# Patient Record
Sex: Male | Born: 1965
Health system: Southern US, Community
[De-identification: ages and names within clinical notes are randomized; demographics above are authoritative.]

## PROBLEM LIST (undated history)

## (undated) DIAGNOSIS — N201 Calculus of ureter: Secondary | ICD-10-CM

## (undated) DIAGNOSIS — Z87442 Personal history of urinary calculi: Secondary | ICD-10-CM

## (undated) DIAGNOSIS — I10 Essential (primary) hypertension: Secondary | ICD-10-CM

## (undated) DIAGNOSIS — N200 Calculus of kidney: Secondary | ICD-10-CM

## (undated) DIAGNOSIS — Z9189 Other specified personal risk factors, not elsewhere classified: Secondary | ICD-10-CM

## (undated) DIAGNOSIS — Z973 Presence of spectacles and contact lenses: Secondary | ICD-10-CM

## (undated) DIAGNOSIS — R319 Hematuria, unspecified: Secondary | ICD-10-CM

## (undated) HISTORY — DX: Essential (primary) hypertension: I10

---

## 1999-05-10 ENCOUNTER — Ambulatory Visit (HOSPITAL_COMMUNITY): Admission: RE | Admit: 1999-05-10 | Discharge: 1999-05-10 | Payer: Self-pay | Admitting: Neurosurgery

## 1999-05-10 ENCOUNTER — Encounter: Payer: Self-pay | Admitting: Neurosurgery

## 1999-10-17 ENCOUNTER — Ambulatory Visit (HOSPITAL_COMMUNITY): Admission: RE | Admit: 1999-10-17 | Discharge: 1999-10-17 | Payer: Self-pay | Admitting: Neurosurgery

## 1999-10-17 ENCOUNTER — Encounter: Payer: Self-pay | Admitting: Neurosurgery

## 2000-02-15 ENCOUNTER — Observation Stay (HOSPITAL_COMMUNITY): Admission: EM | Admit: 2000-02-15 | Discharge: 2000-02-16 | Payer: Self-pay | Admitting: *Deleted

## 2000-02-15 ENCOUNTER — Encounter: Payer: Self-pay | Admitting: *Deleted

## 2005-05-28 ENCOUNTER — Emergency Department (HOSPITAL_COMMUNITY): Admission: EM | Admit: 2005-05-28 | Discharge: 2005-05-28 | Payer: Self-pay | Admitting: Family Medicine

## 2005-10-04 ENCOUNTER — Emergency Department (HOSPITAL_COMMUNITY): Admission: EM | Admit: 2005-10-04 | Discharge: 2005-10-04 | Payer: Self-pay | Admitting: Family Medicine

## 2006-04-19 ENCOUNTER — Emergency Department (HOSPITAL_COMMUNITY): Admission: EM | Admit: 2006-04-19 | Discharge: 2006-04-19 | Payer: Self-pay | Admitting: Emergency Medicine

## 2006-08-07 ENCOUNTER — Emergency Department (HOSPITAL_COMMUNITY): Admission: EM | Admit: 2006-08-07 | Discharge: 2006-08-07 | Payer: Self-pay | Admitting: Family Medicine

## 2007-03-24 ENCOUNTER — Emergency Department (HOSPITAL_COMMUNITY): Admission: EM | Admit: 2007-03-24 | Discharge: 2007-03-24 | Payer: Self-pay | Admitting: Family Medicine

## 2007-04-17 ENCOUNTER — Emergency Department (HOSPITAL_COMMUNITY): Admission: EM | Admit: 2007-04-17 | Discharge: 2007-04-17 | Payer: Self-pay | Admitting: Emergency Medicine

## 2009-08-04 ENCOUNTER — Encounter: Admission: RE | Admit: 2009-08-04 | Discharge: 2009-08-04 | Payer: Self-pay | Admitting: Orthopedic Surgery

## 2010-11-21 ENCOUNTER — Inpatient Hospital Stay (INDEPENDENT_AMBULATORY_CARE_PROVIDER_SITE_OTHER)
Admission: RE | Admit: 2010-11-21 | Discharge: 2010-11-21 | Disposition: A | Discharge status: Home / Self Care | Payer: BC Managed Care – PPO | Source: Ambulatory Visit | Attending: Family Medicine | Admitting: Family Medicine

## 2010-11-21 DIAGNOSIS — L0231 Cutaneous abscess of buttock: Secondary | ICD-10-CM

## 2010-11-21 DIAGNOSIS — L03317 Cellulitis of buttock: Secondary | ICD-10-CM

## 2010-11-24 ENCOUNTER — Inpatient Hospital Stay (HOSPITAL_COMMUNITY)
Admission: RE | Admit: 2010-11-24 | Discharge: 2010-11-24 | Disposition: A | Discharge status: Home / Self Care | Payer: BC Managed Care – PPO | Source: Ambulatory Visit | Attending: Family Medicine | Admitting: Family Medicine

## 2010-11-24 LAB — CULTURE, ROUTINE-ABSCESS

## 2012-09-13 HISTORY — PX: KNEE ARTHROSCOPY W/ MENISCECTOMY: SHX1879

## 2012-11-02 ENCOUNTER — Other Ambulatory Visit (HOSPITAL_COMMUNITY): Payer: Self-pay | Admitting: Orthopedic Surgery

## 2012-11-02 DIAGNOSIS — M25562 Pain in left knee: Secondary | ICD-10-CM

## 2012-11-06 ENCOUNTER — Ambulatory Visit (HOSPITAL_COMMUNITY)
Admission: RE | Admit: 2012-11-06 | Discharge: 2012-11-06 | Disposition: A | Discharge status: Home / Self Care | Payer: BC Managed Care – PPO | Source: Ambulatory Visit | Attending: Orthopedic Surgery | Admitting: Orthopedic Surgery

## 2012-11-06 DIAGNOSIS — M25562 Pain in left knee: Secondary | ICD-10-CM

## 2012-11-06 DIAGNOSIS — M25569 Pain in unspecified knee: Secondary | ICD-10-CM | POA: Insufficient documentation

## 2012-11-06 DIAGNOSIS — IMO0002 Reserved for concepts with insufficient information to code with codable children: Secondary | ICD-10-CM | POA: Insufficient documentation

## 2012-11-06 DIAGNOSIS — R609 Edema, unspecified: Secondary | ICD-10-CM | POA: Insufficient documentation

## 2012-11-06 DIAGNOSIS — W19XXXA Unspecified fall, initial encounter: Secondary | ICD-10-CM | POA: Insufficient documentation

## 2014-10-03 ENCOUNTER — Ambulatory Visit (INDEPENDENT_AMBULATORY_CARE_PROVIDER_SITE_OTHER): Payer: BLUE CROSS/BLUE SHIELD

## 2014-10-03 ENCOUNTER — Ambulatory Visit (INDEPENDENT_AMBULATORY_CARE_PROVIDER_SITE_OTHER): Payer: BLUE CROSS/BLUE SHIELD | Admitting: Family Medicine

## 2014-10-03 VITALS — BP 128/86 | HR 99 | Temp 98.5°F | Resp 20 | Ht 72.5 in | Wt 308.4 lb

## 2014-10-03 DIAGNOSIS — R109 Unspecified abdominal pain: Secondary | ICD-10-CM

## 2014-10-03 DIAGNOSIS — D72829 Elevated white blood cell count, unspecified: Secondary | ICD-10-CM

## 2014-10-03 DIAGNOSIS — Z87442 Personal history of urinary calculi: Secondary | ICD-10-CM

## 2014-10-03 LAB — POCT UA - MICROSCOPIC ONLY
Bacteria, U Microscopic: NEGATIVE
Casts, Ur, LPF, POC: NEGATIVE
Crystals, Ur, HPF, POC: NEGATIVE
Yeast, UA: NEGATIVE

## 2014-10-03 LAB — POCT CBC
Granulocyte percent: 86.5 % — AB (ref 37–80)
HCT, POC: 48.8 % (ref 43.5–53.7)
Hemoglobin: 16.2 g/dL (ref 14.1–18.1)
Lymph, poc: 1.2 (ref 0.6–3.4)
MCH, POC: 31 pg (ref 27–31.2)
MCHC: 33.2 g/dL (ref 31.8–35.4)
MCV: 93.6 fL (ref 80–97)
MID (cbc): 0.3 (ref 0–0.9)
MPV: 7.8 fL (ref 0–99.8)
POC Granulocyte: 10 — AB (ref 2–6.9)
POC LYMPH PERCENT: 10.5 % (ref 10–50)
POC MID %: 3 % (ref 0–12)
Platelet Count, POC: 180 10*3/uL (ref 142–424)
RBC: 5.21 M/uL (ref 4.69–6.13)
RDW, POC: 13.1 %
WBC: 11.6 10*3/uL — AB (ref 4.6–10.2)

## 2014-10-03 LAB — POCT URINALYSIS DIPSTICK
Bilirubin, UA: NEGATIVE
Glucose, UA: NEGATIVE
Ketones, UA: NEGATIVE
Leukocytes, UA: NEGATIVE
Nitrite, UA: NEGATIVE
Spec Grav, UA: 1.02
Urobilinogen, UA: 0.2
pH, UA: 5

## 2014-10-03 LAB — COMPREHENSIVE METABOLIC PANEL
AST: 23 U/L (ref 0–37)
Calcium: 9.8 mg/dL (ref 8.4–10.5)
Creat: 1.47 mg/dL — ABNORMAL HIGH (ref 0.50–1.35)
Potassium: 3.8 mEq/L (ref 3.5–5.3)
Sodium: 142 mEq/L (ref 135–145)

## 2014-10-03 LAB — COMPREHENSIVE METABOLIC PANEL WITH GFR
ALT: 15 U/L (ref 0–53)
Albumin: 4.5 g/dL (ref 3.5–5.2)
Alkaline Phosphatase: 63 U/L (ref 39–117)
BUN: 20 mg/dL (ref 6–23)
CO2: 29 meq/L (ref 19–32)
Chloride: 103 meq/L (ref 96–112)
Glucose, Bld: 121 mg/dL — ABNORMAL HIGH (ref 70–99)
Total Bilirubin: 0.9 mg/dL (ref 0.2–1.2)
Total Protein: 7.7 g/dL (ref 6.0–8.3)

## 2014-10-03 MED ORDER — HYDROCODONE-ACETAMINOPHEN 5-325 MG PO TABS: 1.0000 | ORAL_TABLET | Freq: Four times a day (QID) | ORAL | Status: DC | PRN

## 2014-10-03 MED ORDER — TAMSULOSIN HCL 0.4 MG PO CAPS: 0.4000 mg | ORAL_CAPSULE | Freq: Every day | ORAL | Status: DC

## 2014-10-03 MED ORDER — KETOROLAC TROMETHAMINE 60 MG/2ML IM SOLN
60.0000 mg | Freq: Once | INTRAMUSCULAR | Status: AC
  Administered 2014-10-03: 60 mg via INTRAMUSCULAR

## 2014-10-03 MED ORDER — CEPHALEXIN 500 MG PO CAPS: 500.0000 mg | ORAL_CAPSULE | Freq: Two times a day (BID) | ORAL | Status: DC

## 2014-10-03 NOTE — Progress Notes (Signed)
Chief Complaint:  Chief Complaint  Patient presents with  . Nephrolithiasis    Started having symptoms around 230am this morning.     HPI: George Ortega is a 49 y.o. male who is here for  Left flank pain starting  this AM, woke him up from sleep, he ahd some pain pills left over and took it, he has had hx of kidney stone, no fevers but did have 8/10 pain, reminds him of prior kidney stones, he had some norco left over and took that and no whe has limited pain. He has been dehydrated. Last renal stone was on right side. HE has been able to drink adn eat but not his usual. He denies fevers, chills, dysuria, +/- nausea, no emesis.  Past Medical History  Diagnosis Date  . Hypertension    Past Surgical History  Procedure Laterality Date  . Knee surgery Left    History   Social History  . Marital Status: Married    Spouse Name: N/A    Number of Children: N/A  . Years of Education: N/A   Social History Main Topics  . Smoking status: Never Smoker   . Smokeless tobacco: None  . Alcohol Use: 0.0 oz/week    0 Not specified per week     Comment: Socially  . Drug Use: No  . Sexual Activity: None   Other Topics Concern  . None   Social History Narrative  . None   Family History  Problem Relation Age of Onset  . Hypertension Mother   . Cancer Father   . Heart disease Father   . Hypertension Father    No Known Allergies Prior to Admission medications   Medication Sig Start Date End Date Taking? Authorizing Provider  chlorthalidone (HYGROTON) 25 MG tablet Take 25 mg by mouth daily.   Yes Historical Provider, MD  metoprolol succinate (TOPROL-XL) 100 MG 24 hr tablet Take 100 mg by mouth daily. Take with or immediately following a meal.   Yes Historical Provider, MD  potassium chloride (KLOR-CON) 20 MEQ packet Take 20 mEq by mouth daily.   Yes Historical Provider, MD  telmisartan (MICARDIS) 80 MG tablet Take 80 mg by mouth daily.   Yes Historical Provider, MD     ROS:  The patient denies fevers, chills, night sweats, unintentional weight loss, chest pain, palpitations, wheezing, dyspnea on exertion,abdominal pain, dysuria, hematuria, melena, numbness, weakness, or tingling.   All other systems have been reviewed and were otherwise negative with the exception of those mentioned in the HPI and as above.    PHYSICAL EXAM: Filed Vitals:   10/03/14 0845  BP: 128/86  Pulse: 99  Temp: 98.5 F (36.9 C)  Resp: 20   Filed Vitals:   10/03/14 0845  Height: 6' 0.5" (1.842 m)  Weight: 308 lb 6.4 oz (139.889 kg)   Body mass index is 41.23 kg/(m^2).  General: Alert, no acute distress, obese amle HEENT:  Normocephalic, atraumatic, oropharynx patent. EOMI, PERRLA Cardiovascular:  Regular rate and rhythm, no rubs murmurs or gallops. Radial pulse intact. No pedal edema.  Respiratory: Clear to auscultation bilaterally.  No wheezes, rales, or rhonchi.  No cyanosis, no use of accessory musculature GI: No organomegaly, abdomen is soft and minimally tender, positive bowel sounds.  No masses. Skin: No rashes. Neurologic: Facial musculature symmetric. Psychiatric: Patient is appropriate throughout our interaction. Lymphatic: No cervical lymphadenopathy Musculoskeletal: Gait intact. + flank pain   LABS: Results for orders placed or performed  in visit on 10/03/14  POCT urinalysis dipstick  Result Value Ref Range   Color, UA yellow    Clarity, UA clear    Glucose, UA neg    Bilirubin, UA neg    Ketones, UA neg    Spec Grav, UA 1.020    Blood, UA large    pH, UA 5.0    Protein, UA trace    Urobilinogen, UA 0.2    Nitrite, UA neg    Leukocytes, UA Negative   POCT UA - Microscopic Only  Result Value Ref Range   WBC, Ur, HPF, POC 0-2    RBC, urine, microscopic 15-21    Bacteria, U Microscopic neg    Mucus, UA trace    Epithelial cells, urine per micros 0-1    Crystals, Ur, HPF, POC neg    Casts, Ur, LPF, POC neg    Yeast, UA neg   POCT CBC  Result Value  Ref Range   WBC 11.6 (A) 4.6 - 10.2 K/uL   Lymph, poc 1.2 0.6 - 3.4   POC LYMPH PERCENT 10.5 10 - 50 %L   MID (cbc) 0.3 0 - 0.9   POC MID % 3.0 0 - 12 %M   POC Granulocyte 10.0 (A) 2 - 6.9   Granulocyte percent 86.5 (A) 37 - 80 %G   RBC 5.21 4.69 - 6.13 M/uL   Hemoglobin 16.2 14.1 - 18.1 g/dL   HCT, POC 16.1 09.6 - 53.7 %   MCV 93.6 80 - 97 fL   MCH, POC 31.0 27 - 31.2 pg   MCHC 33.2 31.8 - 35.4 g/dL   RDW, POC 04.5 %   Platelet Count, POC 180 142 - 424 K/uL   MPV 7.8 0 - 99.8 fL     EKG/XRAY:   Primary read interpreted by Dr. Conley Rolls at [facility]. No obvious renal stones, please comment if those are phleboliths in the bladder    ASSESSMENT/PLAN: Encounter Diagnoses  Name Primary?  . Left flank pain Yes  . History of kidney stones   . Leukocytosis    Most likely renal stone that has passed Rx norco, flomax, keflex x 3 days due to elevated white count and nausea Strainer given IVF in house x 2 L, advise to push Fluids at home and advance clear liquids, BRAT diet as tolerated CMP pending F/u prn   Gross sideeffects, risk and benefits, and alternatives of medications d/w patient. Patient is aware that all medications have potential sideeffects and we are unable to predict every sideeffect or drug-drug interaction that may occur.  Laquisha Northcraft PHUONG, DO 10/03/2014 10:48 AM

## 2014-10-03 NOTE — Patient Instructions (Signed)
Ureteral Colic (Kidney Stones) °Ureteral colic is the result of a condition when kidney stones form inside the kidney. Once kidney stones are formed they may move into the tube that connects the kidney with the bladder (ureter). If this occurs, this condition may cause pain (colic) in the ureter.  °CAUSES  °Pain is caused by stone movement in the ureter and the obstruction caused by the stone. °SYMPTOMS  °The pain comes and goes as the ureter contracts around the stone. The pain is usually intense, sharp, and stabbing in character. The location of the pain may move as the stone moves through the ureter. When the stone is near the kidney the pain is usually located in the back and radiates to the belly (abdomen). When the stone is ready to pass into the bladder the pain is often located in the lower abdomen on the side the stone is located. At this location, the symptoms may mimic those of a urinary tract infection with urinary frequency. Once the stone is located here it often passes into the bladder and the pain disappears completely. °TREATMENT  °· Your caregiver will provide you with medicine for pain relief. °· You may require specialized follow-up X-rays. °· The absence of pain does not always mean that the stone has passed. It may have just stopped moving. If the urine remains completely obstructed, it can cause loss of kidney function or even complete destruction of the involved kidney. It is your responsibility and in your interest that X-rays and follow-ups as suggested by your caregiver are completed. Relief of pain without passage of the stone can be associated with severe damage to the kidney, including loss of kidney function on that side. °· If your stone does not pass on its own, additional measures may be taken by your caregiver to ensure its removal. °HOME CARE INSTRUCTIONS  °· Increase your fluid intake. Water is the preferred fluid since juices containing vitamin C may acidify the urine making it  less likely for certain stones (uric acid stones) to pass. °· Strain all urine. A strainer will be provided. Keep all particulate matter or stones for your caregiver to inspect. °· Take your pain medicine as directed. °· Make a follow-up appointment with your caregiver as directed. °· Remember that the goal is passage of your stone. The absence of pain does not mean the stone is gone. Follow your caregiver's instructions. °· Only take over-the-counter or prescription medicines for pain, discomfort, or fever as directed by your caregiver. °SEEK MEDICAL CARE IF:  °· Pain cannot be controlled with the prescribed medicine. °· You have a fever. °· Pain continues for longer than your caregiver advises it should. °· There is a change in the pain, and you develop chest discomfort or constant abdominal pain. °· You feel faint or pass out. °MAKE SURE YOU:  °· Understand these instructions. °· Will watch your condition. °· Will get help right away if you are not doing well or get worse. °Document Released: 06/09/2005 Document Revised: 12/25/2012 Document Reviewed: 02/24/2011 °Exit[employer]® Patient Information ©2015 [employer]. This information is not intended to replace advice given to you by your health care provider. Make sure you discuss any questions you have with your health care provider. ° °

## 2014-10-09 ENCOUNTER — Other Ambulatory Visit: Payer: Self-pay | Admitting: Urology

## 2014-10-10 ENCOUNTER — Encounter (HOSPITAL_BASED_OUTPATIENT_CLINIC_OR_DEPARTMENT_OTHER): Payer: Self-pay | Admitting: *Deleted

## 2014-10-10 NOTE — Progress Notes (Signed)
NPO AFTER MN. ARRIVE AT [id number]. NEEDS EKG.  CURRENT LAB RESULTS IN CHART AND EPIC. WILL  TAKE MICARDIS AND TOPROL AM DOS W/ SIPS OF WATER.

## 2014-10-10 NOTE — Progress Notes (Signed)
   10/10/14 1121  OBSTRUCTIVE SLEEP APNEA  Have you ever been diagnosed with sleep apnea through a sleep study? No  Do you snore loudly (loud enough to be heard through closed doors)?  1  Do you often feel tired, fatigued, or sleepy during the daytime? 0  Has anyone observed you stop breathing during your sleep? 1  Do you have, or are you being treated for high blood pressure? 1  BMI more than 35 kg/m2? 1  Age over 50 years old? 0  Neck circumference greater than 40 cm/16 inches? 1  Gender: 1  Obstructive Sleep Apnea Score 6  Score 4 or greater  Results sent to PCP

## 2014-10-13 ENCOUNTER — Encounter (HOSPITAL_BASED_OUTPATIENT_CLINIC_OR_DEPARTMENT_OTHER): Payer: Self-pay | Admitting: Anesthesiology

## 2014-10-13 NOTE — Anesthesia Preprocedure Evaluation (Deleted)
Anesthesia Evaluation  Patient identified by MRN, date of birth, ID band Patient awake    Reviewed: Allergy & Precautions, NPO status , Patient's Chart, lab work & pertinent test results  History of Anesthesia Complications Negative for: history of anesthetic complications  Airway Mallampati: III  TM Distance: >3 FB Neck ROM: Full    Dental no notable dental hx. (+) Dental Advisory Given   Pulmonary neg pulmonary ROS,  breath sounds clear to auscultation  Pulmonary exam normal       Cardiovascular hypertension, Pt. on medications and Pt. on home beta blockers Rhythm:Regular Rate:Normal     Neuro/Psych negative neurological ROS  negative psych ROS   GI/Hepatic negative GI ROS, Neg liver ROS,   Endo/Other  Morbid obesity  Renal/GU Renal disease  negative genitourinary   Musculoskeletal negative musculoskeletal ROS (+)   Abdominal   Peds negative pediatric ROS (+)  Hematology negative hematology ROS (+)   Anesthesia Other Findings   Reproductive/Obstetrics negative OB ROS                             Anesthesia Physical Anesthesia Plan  ASA: III  Anesthesia Plan: General   Post-op Pain Management:    Induction: Intravenous  Airway Management Planned: LMA  Additional Equipment:   Intra-op Plan:   Post-operative Plan: Extubation in OR  Informed Consent: I have reviewed the patients History and Physical, chart, labs and discussed the procedure including the risks, benefits and alternatives for the proposed anesthesia with the patient or authorized representative who has indicated his/her understanding and acceptance.   Dental advisory given  Plan Discussed with: CRNA  Anesthesia Plan Comments:         Anesthesia Quick Evaluation

## 2014-10-15 ENCOUNTER — Ambulatory Visit (HOSPITAL_BASED_OUTPATIENT_CLINIC_OR_DEPARTMENT_OTHER): Admission: RE | Admit: 2014-10-15 | Payer: BLUE CROSS/BLUE SHIELD | Source: Ambulatory Visit | Admitting: Urology

## 2014-10-15 HISTORY — DX: Calculus of kidney: N20.0

## 2014-10-15 HISTORY — DX: Other specified personal risk factors, not elsewhere classified: Z91.89

## 2014-10-15 HISTORY — DX: Hematuria, unspecified: R31.9

## 2014-10-15 HISTORY — DX: Personal history of urinary calculi: Z87.442

## 2014-10-15 HISTORY — DX: Calculus of ureter: N20.1

## 2014-10-15 HISTORY — DX: Presence of spectacles and contact lenses: Z97.3

## 2014-10-15 SURGERY — CYSTOURETEROSCOPY, WITH RETROGRADE PYELOGRAM AND STENT INSERTION
Anesthesia: General | Laterality: Left

## 2015-08-22 ENCOUNTER — Other Ambulatory Visit: Payer: Self-pay | Admitting: Nurse Practitioner

## 2015-08-22 DIAGNOSIS — R200 Anesthesia of skin: Secondary | ICD-10-CM

## 2015-09-17 ENCOUNTER — Other Ambulatory Visit: Payer: BLUE CROSS/BLUE SHIELD

## 2015-12-09 IMAGING — CR DG ABDOMEN 1V
1 series · 1 of 1 positions shown · non-contrast
Comparison: None.

CLINICAL DATA: 48-year-old male with left flank pain. History of
kidney stones.

EXAM:
ABDOMEN - 1 VIEW

[AP]
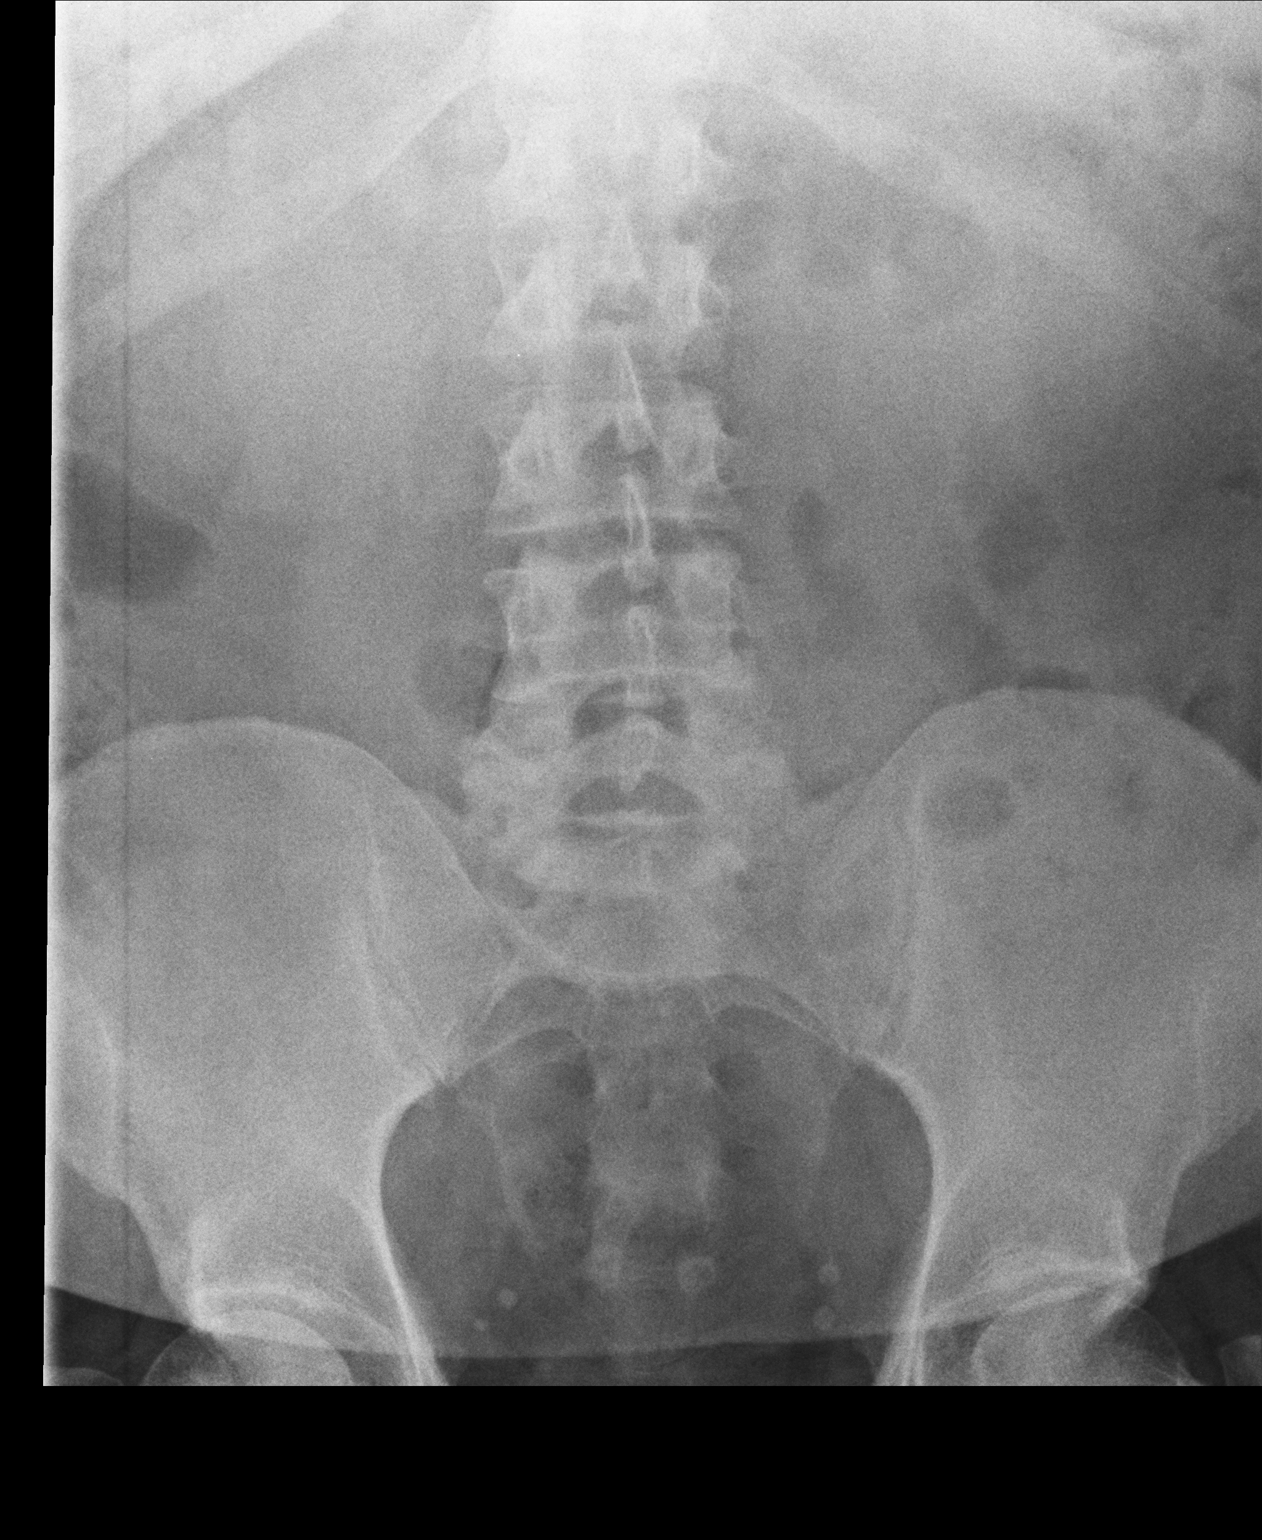

[1 of 1 positions shown; findings below may reference images not displayed]

FINDINGS: No plain film evidence of renal or ureteral calculi. Calcifications
within the pelvis have an appearance most suggestive of phleboliths.

If further delineation is clinically desired CT imaging may be
considered.

Nonspecific bowel gas pattern without plain film evidence of bowel
obstruction.

Osseous structures appear intact.
IMPRESSION: No plain film evidence of renal or ureteral calculi as noted above.

## 2016-03-23 DIAGNOSIS — S41111A Laceration without foreign body of right upper arm, initial encounter: Secondary | ICD-10-CM | POA: Diagnosis not present

## 2016-03-23 DIAGNOSIS — N183 Chronic kidney disease, stage 3 (moderate): Secondary | ICD-10-CM | POA: Diagnosis not present

## 2016-03-23 DIAGNOSIS — I129 Hypertensive chronic kidney disease with stage 1 through stage 4 chronic kidney disease, or unspecified chronic kidney disease: Secondary | ICD-10-CM | POA: Diagnosis not present

## 2016-03-23 DIAGNOSIS — I10 Essential (primary) hypertension: Secondary | ICD-10-CM | POA: Diagnosis not present

## 2016-03-23 DIAGNOSIS — Z23 Encounter for immunization: Secondary | ICD-10-CM | POA: Diagnosis not present

## 2016-05-19 DIAGNOSIS — H5213 Myopia, bilateral: Secondary | ICD-10-CM | POA: Diagnosis not present

## 2016-09-23 DIAGNOSIS — I10 Essential (primary) hypertension: Secondary | ICD-10-CM | POA: Diagnosis not present

## 2016-10-08 DIAGNOSIS — J1001 Influenza due to other identified influenza virus with the same other identified influenza virus pneumonia: Secondary | ICD-10-CM | POA: Diagnosis not present

## 2016-10-08 DIAGNOSIS — J200 Acute bronchitis due to Mycoplasma pneumoniae: Secondary | ICD-10-CM | POA: Diagnosis not present

## 2016-11-12 ENCOUNTER — Encounter: Payer: Self-pay | Admitting: Internal Medicine

## 2017-01-10 ENCOUNTER — Encounter: Payer: Self-pay | Admitting: Internal Medicine

## 2017-01-10 ENCOUNTER — Ambulatory Visit: Payer: BLUE CROSS/BLUE SHIELD

## 2017-01-10 VITALS — Ht 71.0 in | Wt 273.0 lb

## 2017-01-10 DIAGNOSIS — Z1211 Encounter for screening for malignant neoplasm of colon: Secondary | ICD-10-CM

## 2017-01-10 MED ORDER — SUPREP BOWEL PREP KIT 17.5-3.13-1.6 GM/177ML PO SOLN: 1.0000 | Freq: Once | ORAL | 0 refills | Status: AC

## 2017-01-10 NOTE — Progress Notes (Signed)
No allergies to eggs or soy No past problems with anesthesia No diet meds No home oxygen  Registered emmi 

## 2017-01-24 ENCOUNTER — Ambulatory Visit (AMBULATORY_SURGERY_CENTER): Payer: BLUE CROSS/BLUE SHIELD | Admitting: Internal Medicine

## 2017-01-24 ENCOUNTER — Encounter: Payer: Self-pay | Admitting: Internal Medicine

## 2017-01-24 VITALS — BP 116/95 | HR 71 | Temp 98.2°F | Resp 20 | Ht 72.5 in | Wt 273.0 lb

## 2017-01-24 DIAGNOSIS — Z1211 Encounter for screening for malignant neoplasm of colon: Secondary | ICD-10-CM

## 2017-01-24 DIAGNOSIS — Z1212 Encounter for screening for malignant neoplasm of rectum: Secondary | ICD-10-CM | POA: Diagnosis not present

## 2017-01-24 MED ORDER — SODIUM CHLORIDE 0.9 % IV SOLN: 500.0000 mL | INTRAVENOUS | Status: AC

## 2017-01-24 NOTE — Patient Instructions (Signed)
Discharge instructions given. Handout on hemorrhoids. Resume previous medications. YOU HAD AN ENDOSCOPIC PROCEDURE TODAY AT THE [facility]:   Refer to the procedure report that was given to you for any specific questions about what was found during the examination.  If the procedure report does not answer your questions, please call your gastroenterologist to clarify.  If you requested that your care partner not be given the details of your procedure findings, then the procedure report has been included in a sealed envelope for you to review at your convenience later.  YOU SHOULD EXPECT: Some feelings of bloating in the abdomen. Passage of more gas than usual.  Walking can help get rid of the air that was put into your GI tract during the procedure and reduce the bloating. If you had a lower endoscopy (such as a colonoscopy or flexible sigmoidoscopy) you may notice spotting of blood in your stool or on the toilet paper. If you underwent a bowel prep for your procedure, you may not have a normal bowel movement for a few days.  Please Note:  You might notice some irritation and congestion in your nose or some drainage.  This is from the oxygen used during your procedure.  There is no need for concern and it should clear up in a day or so.  SYMPTOMS TO REPORT IMMEDIATELY:   Following lower endoscopy (colonoscopy or flexible sigmoidoscopy):  Excessive amounts of blood in the stool  Significant tenderness or worsening of abdominal pains  Swelling of the abdomen that is new, acute  Fever of 100F or higher   For urgent or emergent issues, a gastroenterologist can be reached at any hour by calling [phone].   DIET:  We do recommend a small meal at first, but then you may proceed to your regular diet.  Drink plenty of fluids but you should avoid alcoholic beverages for 24 hours.  ACTIVITY:  You should plan to take it easy for the rest of today and you should NOT DRIVE or use heavy  machinery until tomorrow (because of the sedation medicines used during the test).    FOLLOW UP: Our staff will call the number listed on your records the next business day following your procedure to check on you and address any questions or concerns that you may have regarding the information given to you following your procedure. If we do not reach you, we will leave a message.  However, if you are feeling well and you are not experiencing any problems, there is no need to return our call.  We will assume that you have returned to your regular daily activities without incident.  If any biopsies were taken you will be contacted by phone or by letter within the next 1-3 weeks.  Please call us at [phone] if you have not heard about the biopsies in 3 weeks.    SIGNATURES/CONFIDENTIALITY: You and/or your care partner have signed paperwork which will be entered into your electronic medical record.  These signatures attest to the fact that that the information above on your After Visit Summary has been reviewed and is understood.  Full responsibility of the confidentiality of this discharge information lies with you and/or your care-partner. 

## 2017-01-24 NOTE — Progress Notes (Signed)
Report given to PACU, vss 

## 2017-01-24 NOTE — Op Note (Signed)
[facility] Patient Name: George PalMichael Frisbie Procedure Date: 01/24/2017 8:37 AM MRN: [id number] Endoscopist: Beverley FiedlerJay M Azion Centrella , MD Age: [age] Referring MD:  Date of Birth: April 25, 1966 Gender: Male Account #: [id number] Procedure:                Colonoscopy Indications:              Screening for colorectal malignant neoplasm, This                            is the patient's first colonoscopy Medicines:                Monitored Anesthesia Care Procedure:                Pre-Anesthesia Assessment:                           - Prior to the procedure, a History and Physical                            was performed, and patient medications and                            allergies were reviewed. The patient's tolerance of                            previous anesthesia was also reviewed. The risks                            and benefits of the procedure and the sedation                            options and risks were discussed with the patient.                            All questions were answered, and informed consent                            was obtained. Prior Anticoagulants: The patient has                            taken no previous anticoagulant or antiplatelet                            agents. ASA Grade Assessment: II - A patient with                            mild systemic disease. After reviewing the risks                            and benefits, the patient was deemed in                            satisfactory condition to undergo the procedure.  After obtaining informed consent, the colonoscope                            was passed under direct vision. Throughout the                            procedure, the patient's blood pressure, pulse, and                            oxygen saturations were monitored continuously. The                            Colonoscope was introduced through the anus and                            advanced to the the terminal  ileum. The colonoscopy                            was performed without difficulty. The patient                            tolerated the procedure well. The quality of the                            bowel preparation was excellent. The terminal                            ileum, ileocecal valve, appendiceal orifice, and                            rectum were photographed. Scope In: 8:47:14 AM Scope Out: 8:59:12 AM Scope Withdrawal Time: 0 hours 9 minutes 3 seconds  Total Procedure Duration: 0 hours 11 minutes 58 seconds  Findings:                 The digital rectal exam was normal.                           The terminal ileum appeared normal.                           Non-bleeding internal hemorrhoids were found during                            retroflexion. The hemorrhoids were small.                           The exam was otherwise without abnormality. No                            polyps found. Complications:            No immediate complications. Estimated Blood Loss:     Estimated blood loss: none. Impression:               - The examined portion of the ileum was normal.                           -  Small non-bleeding internal hemorrhoids.                           - The examination was otherwise normal.                           - No specimens collected. Recommendation:           - Patient has a contact number available for                            emergencies. The signs and symptoms of potential                            delayed complications were discussed with the                            patient. Return to normal activities tomorrow.                            Written discharge instructions were provided to the                            patient.                           - Resume previous diet.                           - Continue present medications.                           - Repeat colonoscopy in 10 years for screening                            purposes. Beverley Fiedler, MD 01/24/2017 9:03:16 AM This report has been signed electronically.

## 2017-01-25 ENCOUNTER — Telehealth: Payer: Self-pay | Admitting: *Deleted

## 2017-01-25 NOTE — Telephone Encounter (Signed)
  Follow up Call-  Call back number 01/24/2017  Post procedure Call Back phone  # [phone]  Permission to leave phone message Yes  Some recent data might be hidden     Patient questions:  Do you have a fever, pain , or abdominal swelling? No. Pain Score  0 *  Have you tolerated food without any problems? Yes.    Have you been able to return to your normal activities? Yes.    Do you have any questions about your discharge instructions: Diet   No. Medications  No. Follow up visit  No.  Do you have questions or concerns about your Care? No.  Actions: * If pain score is 4 or above: No action needed, pain <4.

## 2017-04-21 DIAGNOSIS — I10 Essential (primary) hypertension: Secondary | ICD-10-CM | POA: Diagnosis not present

## 2017-06-09 DIAGNOSIS — H524 Presbyopia: Secondary | ICD-10-CM | POA: Diagnosis not present

## 2017-06-09 DIAGNOSIS — H52223 Regular astigmatism, bilateral: Secondary | ICD-10-CM | POA: Diagnosis not present

## 2017-06-09 DIAGNOSIS — H5213 Myopia, bilateral: Secondary | ICD-10-CM | POA: Diagnosis not present

## 2017-08-17 DIAGNOSIS — D485 Neoplasm of uncertain behavior of skin: Secondary | ICD-10-CM | POA: Diagnosis not present

## 2017-08-17 DIAGNOSIS — C44612 Basal cell carcinoma of skin of right upper limb, including shoulder: Secondary | ICD-10-CM | POA: Diagnosis not present

## 2017-08-17 DIAGNOSIS — L570 Actinic keratosis: Secondary | ICD-10-CM | POA: Diagnosis not present

## 2017-09-27 DIAGNOSIS — C44612 Basal cell carcinoma of skin of right upper limb, including shoulder: Secondary | ICD-10-CM | POA: Diagnosis not present

## 2017-09-27 DIAGNOSIS — L280 Lichen simplex chronicus: Secondary | ICD-10-CM | POA: Diagnosis not present

## 2017-09-27 DIAGNOSIS — D1801 Hemangioma of skin and subcutaneous tissue: Secondary | ICD-10-CM | POA: Diagnosis not present

## 2017-09-27 DIAGNOSIS — D485 Neoplasm of uncertain behavior of skin: Secondary | ICD-10-CM | POA: Diagnosis not present

## 2017-09-27 DIAGNOSIS — L814 Other melanin hyperpigmentation: Secondary | ICD-10-CM | POA: Diagnosis not present

## 2017-10-27 DIAGNOSIS — C44612 Basal cell carcinoma of skin of right upper limb, including shoulder: Secondary | ICD-10-CM | POA: Diagnosis not present

## 2017-12-07 ENCOUNTER — Other Ambulatory Visit: Payer: Self-pay | Admitting: Family

## 2017-12-07 ENCOUNTER — Ambulatory Visit
Admission: RE | Admit: 2017-12-07 | Discharge: 2017-12-07 | Disposition: A | Discharge status: Home / Self Care | Payer: BLUE CROSS/BLUE SHIELD | Source: Ambulatory Visit | Attending: Family | Admitting: Family

## 2017-12-07 DIAGNOSIS — M25571 Pain in right ankle and joints of right foot: Secondary | ICD-10-CM

## 2017-12-07 DIAGNOSIS — M19071 Primary osteoarthritis, right ankle and foot: Secondary | ICD-10-CM | POA: Diagnosis not present

## 2018-05-19 ENCOUNTER — Telehealth: Payer: Self-pay | Admitting: Family Medicine

## 2018-05-24 NOTE — Telephone Encounter (Signed)
error 

## 2018-05-30 DIAGNOSIS — M25562 Pain in left knee: Secondary | ICD-10-CM | POA: Diagnosis not present

## 2018-05-31 MED FILL — HYDROCODON-APAP 5-325: 5-325 | 10 days supply | Qty: 30 | Fill #0

## 2018-06-06 DIAGNOSIS — M25562 Pain in left knee: Secondary | ICD-10-CM | POA: Diagnosis not present

## 2018-06-27 DIAGNOSIS — H524 Presbyopia: Secondary | ICD-10-CM | POA: Diagnosis not present

## 2018-06-27 DIAGNOSIS — H5213 Myopia, bilateral: Secondary | ICD-10-CM | POA: Diagnosis not present

## 2018-06-27 DIAGNOSIS — H52223 Regular astigmatism, bilateral: Secondary | ICD-10-CM | POA: Diagnosis not present

## 2018-08-01 MED FILL — IRBESARTAN 150 MG TAB: 150 | 90 days supply | Qty: 90 | Fill #0

## 2018-08-02 MED FILL — CLINDAMYCIN PH 1% GEL: 1 | 15 days supply | Qty: 30 | Fill #0

## 2018-08-02 MED FILL — MITIGARE 0.6 MG CAPSULE: 0.6 | 30 days supply | Qty: 31 | Fill #0

## 2018-08-02 MED FILL — MELOXICAM 15 MG TABLET: 15 | 30 days supply | Qty: 30 | Fill #0

## 2018-08-03 MED FILL — traMADol HCL 50 MG TABS: 50 | 7 days supply | Qty: 21 | Fill #0

## 2018-09-14 MED FILL — CHLORTHALIDONE 25 MG TABS: 25 | 90 days supply | Qty: 90 | Fill #0

## 2018-09-14 MED FILL — METOPROLOL SUCCINATE ER 50: 50 | 90 days supply | Qty: 90 | Fill #0

## 2018-09-27 DIAGNOSIS — L814 Other melanin hyperpigmentation: Secondary | ICD-10-CM | POA: Diagnosis not present

## 2018-09-27 DIAGNOSIS — Z85828 Personal history of other malignant neoplasm of skin: Secondary | ICD-10-CM | POA: Diagnosis not present

## 2018-09-27 DIAGNOSIS — D225 Melanocytic nevi of trunk: Secondary | ICD-10-CM | POA: Diagnosis not present

## 2018-09-27 DIAGNOSIS — L821 Other seborrheic keratosis: Secondary | ICD-10-CM | POA: Diagnosis not present

## 2018-09-27 DIAGNOSIS — L570 Actinic keratosis: Secondary | ICD-10-CM | POA: Diagnosis not present

## 2018-10-31 MED FILL — POTASSIUM CHLORIDE CRYS ER: 20 | 90 days supply | Qty: 90 | Fill #0

## 2018-11-01 MED FILL — IRBESARTAN 150 MG TAB: 150 | 90 days supply | Qty: 90 | Fill #0

## 2018-11-13 MED FILL — MELOXICAM 15 MG TABLET: 15 | 30 days supply | Qty: 30 | Fill #0

## 2018-11-24 MED FILL — ALLOPURINOL 100 MG TABS: 100 | 90 days supply | Qty: 180 | Fill #0

## 2018-12-07 MED FILL — CHLORTHALIDONE 25 MG TABS: 25 | 90 days supply | Qty: 90 | Fill #1

## 2018-12-07 MED FILL — MELOXICAM 15 MG TABLET: 15 | 30 days supply | Qty: 30 | Fill #1

## 2018-12-25 MED FILL — METOPROLOL SUCCINATE ER 50: 50 | 90 days supply | Qty: 90 | Fill #0

## 2019-01-04 MED FILL — predniSONE 10 MG (48) TBPK: 10 | 12 days supply | Qty: 48 | Fill #0

## 2019-01-22 MED FILL — IRBESARTAN 150 MG TABLET: 150 | 90 days supply | Qty: 90 | Fill #1

## 2019-01-22 MED FILL — POTASSIUM CHLORIDE CRYS ER: 20 | 90 days supply | Qty: 90 | Fill #0

## 2019-02-08 MED FILL — MELOXICAM 15 MG TABLET: 15 | 90 days supply | Qty: 90 | Fill #0

## 2019-02-12 IMAGING — DX DG ANKLE 2V *R*
2 series · 2 of 2 positions shown · non-contrast
Comparison: No recent.

CLINICAL DATA: Medial and lateral ankle pain.  No prior injury.

EXAM:
RIGHT ANKLE - 2 VIEW

[dg ankle 2 views right (1 of 2)]
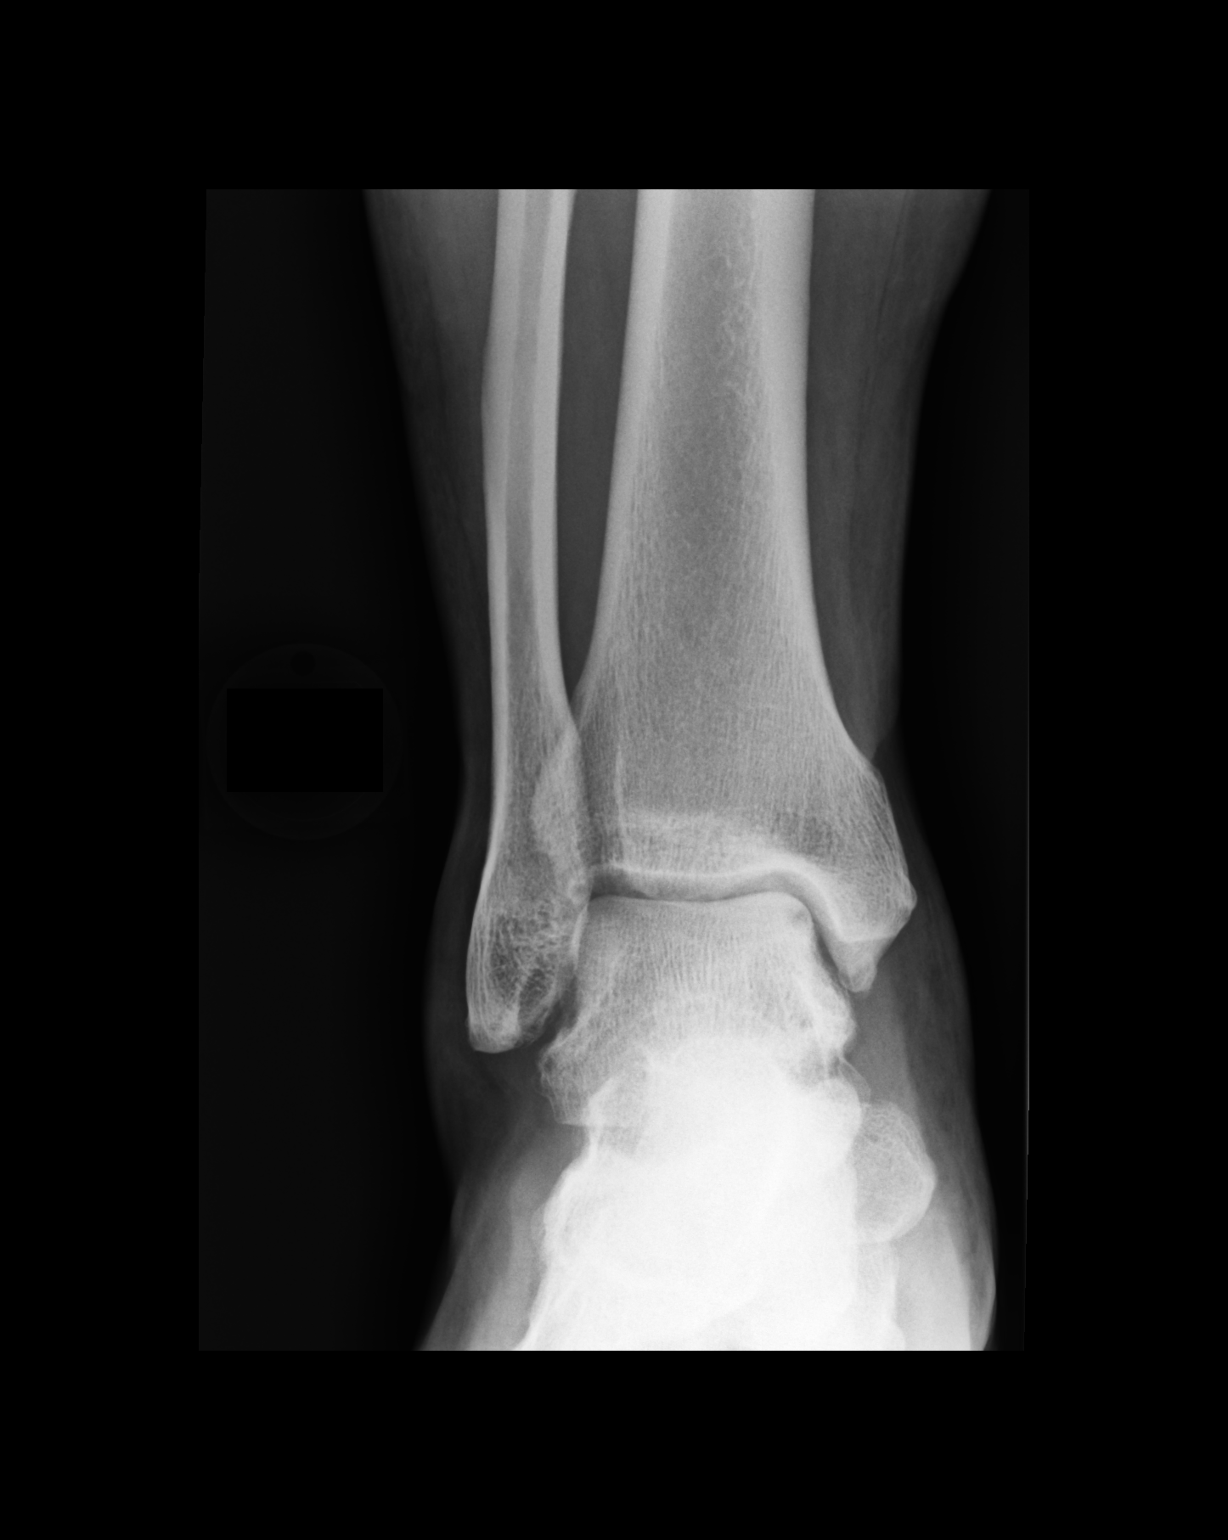

[dg ankle 2 views right (2 of 2)]
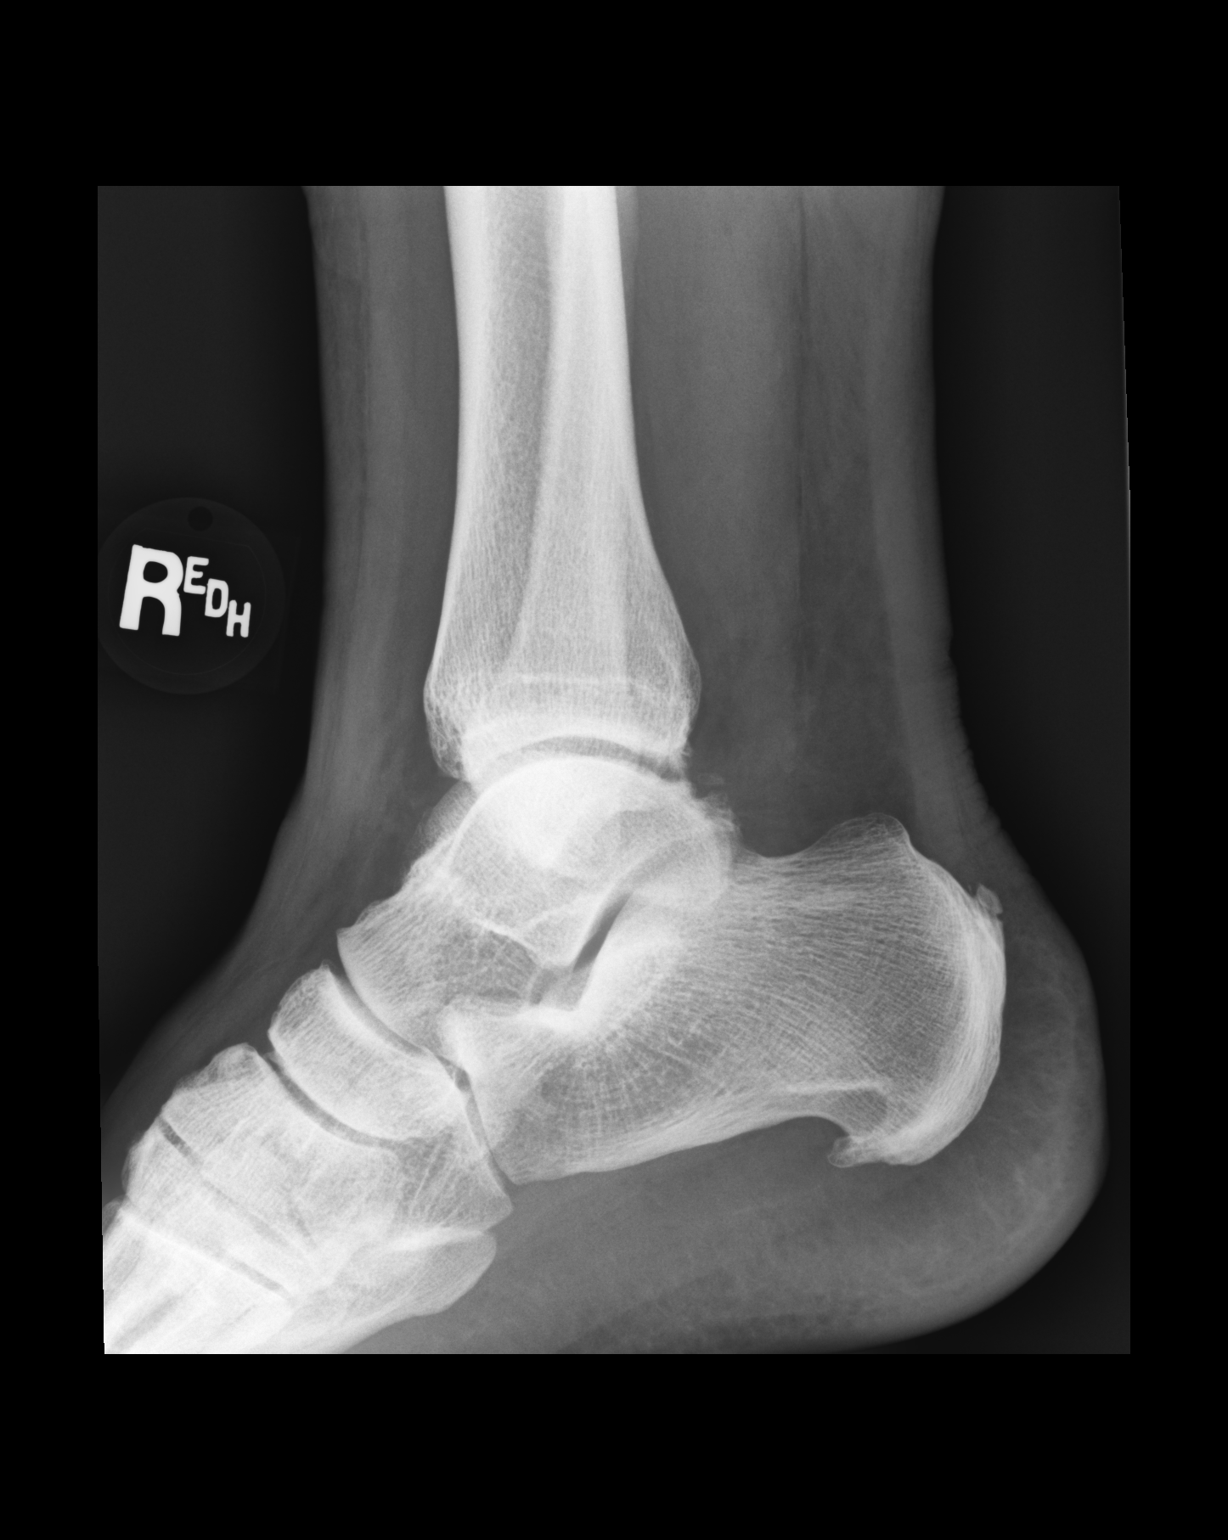

[2 of 2 positions shown; findings below may reference images not displayed]

FINDINGS: Diffuse degenerative change. Mild calcaneal spurring. No evidence of
fracture or dislocation. No acute bony abnormality identified.
IMPRESSION: Diffuse degenerative change right ankle.  No acute bony abnormality.

## 2019-02-19 MED FILL — ALLOPURINOL 100 MG TABS: 100 | 90 days supply | Qty: 180 | Fill #0

## 2019-02-19 MED FILL — CYCLOBENZAPRINE HCL 10 MG T: 10 | 10 days supply | Qty: 30 | Fill #0

## 2019-03-14 MED FILL — CHLORTHALIDONE 25 MG TABLET: 25 | 90 days supply | Qty: 90 | Fill #0

## 2019-03-16 MED FILL — METOPROLOL SUCCINATE ER 50: 50 | 90 days supply | Qty: 90 | Fill #1

## 2019-04-05 DIAGNOSIS — Z20828 Contact with and (suspected) exposure to other viral communicable diseases: Secondary | ICD-10-CM | POA: Diagnosis not present

## 2019-04-27 MED FILL — POTASSIUM CHLORIDE CRYS ER: 20 | 90 days supply | Qty: 90 | Fill #1

## 2019-04-27 MED FILL — IRBESARTAN 150 MG TABS: 150 | 30 days supply | Qty: 30 | Fill #0

## 2019-05-24 MED FILL — ALLOPURINOL 100 MG TABS: 100 | 90 days supply | Qty: 180 | Fill #1

## 2019-05-24 MED FILL — IRBESARTAN 150 MG TABS: 150 | 30 days supply | Qty: 30 | Fill #1

## 2019-06-01 DIAGNOSIS — I129 Hypertensive chronic kidney disease with stage 1 through stage 4 chronic kidney disease, or unspecified chronic kidney disease: Secondary | ICD-10-CM | POA: Diagnosis not present

## 2019-06-01 DIAGNOSIS — Z13220 Encounter for screening for lipoid disorders: Secondary | ICD-10-CM | POA: Diagnosis not present

## 2019-06-01 DIAGNOSIS — Z125 Encounter for screening for malignant neoplasm of prostate: Secondary | ICD-10-CM | POA: Diagnosis not present

## 2019-06-01 DIAGNOSIS — G4733 Obstructive sleep apnea (adult) (pediatric): Secondary | ICD-10-CM | POA: Diagnosis not present

## 2019-06-01 DIAGNOSIS — M1A09X0 Idiopathic chronic gout, multiple sites, without tophus (tophi): Secondary | ICD-10-CM | POA: Diagnosis not present

## 2019-06-01 DIAGNOSIS — K219 Gastro-esophageal reflux disease without esophagitis: Secondary | ICD-10-CM | POA: Diagnosis not present

## 2019-06-01 DIAGNOSIS — Z0000 Encounter for general adult medical examination without abnormal findings: Secondary | ICD-10-CM | POA: Diagnosis not present

## 2019-06-05 MED FILL — ALLOPURINOL 300 MG TABS: 300 | 90 days supply | Qty: 90 | Fill #0

## 2019-06-07 MED FILL — CHLORTHALIDONE 25 MG TABS: 25 | 90 days supply | Qty: 90 | Fill #1

## 2019-06-07 MED FILL — MELOXICAM 15 MG TABLET: 15 | 90 days supply | Qty: 90 | Fill #0

## 2019-06-22 MED FILL — traMADol HCL 50 MG TABS: 50 | 7 days supply | Qty: 21 | Fill #0

## 2019-06-22 MED FILL — ALPRAZolam 0.5 MG TABS: 0.5 | 10 days supply | Qty: 30 | Fill #0

## 2019-06-28 MED FILL — IRBESARTAN 150 MG TABLET: 150 | 30 days supply | Qty: 30 | Fill #2

## 2019-07-03 MED FILL — ALBUTEROL 0.083 MG/ML SOLN: (2.5 MG/3ML | 5 days supply | Qty: 90 | Fill #0

## 2019-07-03 MED FILL — VENTOLIN HFA 90 MCG INHALER: 108 (90 BAS | 16 days supply | Qty: 18 | Fill #0

## 2019-07-18 DIAGNOSIS — M109 Gout, unspecified: Secondary | ICD-10-CM | POA: Diagnosis not present

## 2019-07-28 MED FILL — POTASSIUM CHLORIDE CRYS ER: 20 | 90 days supply | Qty: 90 | Fill #2

## 2019-07-28 MED FILL — IRBESARTAN 150 MG TABLET: 150 | 30 days supply | Qty: 30 | Fill #3

## 2019-08-20 DIAGNOSIS — H524 Presbyopia: Secondary | ICD-10-CM | POA: Diagnosis not present

## 2019-08-20 DIAGNOSIS — H52223 Regular astigmatism, bilateral: Secondary | ICD-10-CM | POA: Diagnosis not present

## 2019-08-20 DIAGNOSIS — H5213 Myopia, bilateral: Secondary | ICD-10-CM | POA: Diagnosis not present

## 2019-08-23 MED FILL — IRBESARTAN 150 MG TABLET: 150 | 30 days supply | Qty: 30 | Fill #4

## 2019-09-06 MED FILL — CHLORTHALIDONE 25 MG TABS: 25 | 90 days supply | Qty: 90 | Fill #2

## 2019-09-17 MED FILL — METOPROLOL SUCCINATE ER 50: 50 | 90 days supply | Qty: 90 | Fill #1

## 2019-09-25 MED FILL — MELOXICAM 15 MG TABLET: 15 | 90 days supply | Qty: 90 | Fill #1

## 2019-09-25 MED FILL — traMADol HCL 50 MG TABS: 50 | 7 days supply | Qty: 21 | Fill #1

## 2019-09-26 MED FILL — IRBESARTAN 150 MG TABLET: 150 | 30 days supply | Qty: 30 | Fill #5

## 2019-10-25 MED FILL — ALLOPURINOL 300 MG TABS: 300 | 90 days supply | Qty: 90 | Fill #1

## 2019-10-25 MED FILL — POTASSIUM CHLORIDE CRYS ER: 20 | 90 days supply | Qty: 90 | Fill #3

## 2019-10-26 MED FILL — IRBESARTAN 150 MG TABLET: 150 | 90 days supply | Qty: 90 | Fill #0

## 2019-11-04 ENCOUNTER — Ambulatory Visit: Payer: Self-pay | Attending: Internal Medicine

## 2019-11-04 DIAGNOSIS — Z23 Encounter for immunization: Secondary | ICD-10-CM | POA: Insufficient documentation

## 2019-11-04 NOTE — Progress Notes (Signed)
   Covid-19 Vaccination Clinic  Name:  JAVANTE NILSSON    MRN: [id number] DOB: 06/13/1966  11/04/2019  Mr. Corsello was observed post Covid-19 immunization for 15 minutes without incidence. He was provided with Vaccine Information Sheet and instruction to access the V-Safe system.   Mr. Bellerose was instructed to call 911 with any severe reactions post vaccine: Marland Kitchen Difficulty breathing  . Swelling of your face and throat  . A fast heartbeat  . A bad rash all over your body  . Dizziness and weakness    Immunizations Administered    Name Date Dose VIS Date Route   Pfizer COVID-19 Vaccine 11/04/2019  3:17 PM 0.3 mL 08/24/2019 Intramuscular   Manufacturer: [employer], [employer]   Lot: [id number]   NDC: [address]-8

## 2019-11-07 DIAGNOSIS — L821 Other seborrheic keratosis: Secondary | ICD-10-CM | POA: Diagnosis not present

## 2019-11-07 DIAGNOSIS — D225 Melanocytic nevi of trunk: Secondary | ICD-10-CM | POA: Diagnosis not present

## 2019-11-07 DIAGNOSIS — Z85828 Personal history of other malignant neoplasm of skin: Secondary | ICD-10-CM | POA: Diagnosis not present

## 2019-11-07 DIAGNOSIS — L814 Other melanin hyperpigmentation: Secondary | ICD-10-CM | POA: Diagnosis not present

## 2019-11-07 DIAGNOSIS — L570 Actinic keratosis: Secondary | ICD-10-CM | POA: Diagnosis not present

## 2019-11-28 ENCOUNTER — Ambulatory Visit: Payer: Self-pay | Attending: Internal Medicine

## 2019-11-28 DIAGNOSIS — Z23 Encounter for immunization: Secondary | ICD-10-CM

## 2019-11-28 NOTE — Progress Notes (Signed)
   Covid-19 Vaccination Clinic  Name:  George Ortega    MRN: [id number] DOB: 02-14-1966  11/28/2019  George Ortega was observed post Covid-19 immunization for 15 minutes without incident. He was provided with Vaccine Information Sheet and instruction to access the V-Safe system.   George Ortega was instructed to call 911 with any severe reactions post vaccine: Marland Kitchen Difficulty breathing  . Swelling of face and throat  . A fast heartbeat  . A bad rash all over body  . Dizziness and weakness   Immunizations Administered    Name Date Dose VIS Date Route   Pfizer COVID-19 Vaccine 11/28/2019  4:39 PM 0.3 mL 08/24/2019 Intramuscular   Manufacturer: [employer], [employer]   Lot: [id number]   NDC: [address]-7

## 2019-12-07 MED FILL — CHLORTHALIDONE 25 MG TABS: 25 | 90 days supply | Qty: 90 | Fill #0

## 2019-12-12 MED FILL — predniSONE 20 MG TABS: 20 | 5 days supply | Qty: 10 | Fill #0

## 2019-12-14 MED FILL — METOPROLOL SUCCINATE ER 50: 50 | 90 days supply | Qty: 90 | Fill #0

## 2019-12-14 MED FILL — traMADol HCL 50 MG TABS: 50 | 6 days supply | Qty: 18 | Fill #2

## 2020-01-21 ENCOUNTER — Other Ambulatory Visit (HOSPITAL_COMMUNITY): Payer: Self-pay | Admitting: Internal Medicine

## 2020-01-28 MED FILL — ALLOPURINOL 300 MG TABS: 300 | 90 days supply | Qty: 90 | Fill #2

## 2020-02-18 ENCOUNTER — Other Ambulatory Visit (HOSPITAL_COMMUNITY): Payer: Self-pay | Admitting: Family Medicine

## 2020-03-11 MED FILL — CHLORTHALIDONE 25 MG TABS: 25 | 90 days supply | Qty: 90 | Fill #0

## 2020-03-11 MED FILL — METOPROLOL SUCCINATE ER 50: 50 | 90 days supply | Qty: 90 | Fill #1

## 2020-04-01 MED FILL — ALPRAZolam 0.5 MG TABS: 0.5 | 30 days supply | Qty: 30 | Fill #0

## 2020-04-01 MED FILL — MECLIZINE HCL 25 MG TABS: 25 | 15 days supply | Qty: 45 | Fill #0

## 2020-04-02 MED FILL — IRBESARTAN 300 MG TAB: 300 | 90 days supply | Qty: 90 | Fill #0

## 2020-04-22 MED FILL — POTASSIUM CHLORIDE CRYS ER: 20 | 90 days supply | Qty: 90 | Fill #1

## 2020-04-22 MED FILL — ALLOPURINOL 300 MG TABS: 300 | 90 days supply | Qty: 90 | Fill #3

## 2020-04-28 ENCOUNTER — Other Ambulatory Visit (HOSPITAL_COMMUNITY): Payer: Self-pay | Admitting: Family Medicine

## 2020-04-28 MED FILL — METOPROLOL SUCCINATE ER 100: 100 | 90 days supply | Qty: 90 | Fill #0

## 2020-06-03 DIAGNOSIS — Z13220 Encounter for screening for lipoid disorders: Secondary | ICD-10-CM | POA: Diagnosis not present

## 2020-06-03 DIAGNOSIS — N1831 Chronic kidney disease, stage 3a: Secondary | ICD-10-CM | POA: Diagnosis not present

## 2020-06-03 DIAGNOSIS — Z1159 Encounter for screening for other viral diseases: Secondary | ICD-10-CM | POA: Diagnosis not present

## 2020-06-03 DIAGNOSIS — Z0000 Encounter for general adult medical examination without abnormal findings: Secondary | ICD-10-CM | POA: Diagnosis not present

## 2020-06-03 DIAGNOSIS — I129 Hypertensive chronic kidney disease with stage 1 through stage 4 chronic kidney disease, or unspecified chronic kidney disease: Secondary | ICD-10-CM | POA: Diagnosis not present

## 2020-06-03 DIAGNOSIS — K219 Gastro-esophageal reflux disease without esophagitis: Secondary | ICD-10-CM | POA: Diagnosis not present

## 2020-06-03 DIAGNOSIS — M1A09X0 Idiopathic chronic gout, multiple sites, without tophus (tophi): Secondary | ICD-10-CM | POA: Diagnosis not present

## 2020-06-03 DIAGNOSIS — I10 Essential (primary) hypertension: Secondary | ICD-10-CM | POA: Diagnosis not present

## 2020-06-03 MED FILL — AMLODIPINE-VALSARTAN 5-320: 5-320 | 30 days supply | Qty: 30 | Fill #0

## 2020-06-07 MED FILL — CHLORTHALIDONE 25 MG TABS: 25 | 90 days supply | Qty: 90 | Fill #1

## 2020-06-18 ENCOUNTER — Other Ambulatory Visit (HOSPITAL_COMMUNITY): Payer: Self-pay | Admitting: Family Medicine

## 2020-06-18 MED FILL — predniSONE 20 MG TABS: 20 | 5 days supply | Qty: 10 | Fill #0

## 2020-06-26 ENCOUNTER — Other Ambulatory Visit (HOSPITAL_COMMUNITY): Payer: Self-pay | Admitting: Family Medicine

## 2020-06-26 MED FILL — AMLODIPINE-VALSARTAN 5-320: 5-320 | 90 days supply | Qty: 90 | Fill #0

## 2020-07-15 DIAGNOSIS — I129 Hypertensive chronic kidney disease with stage 1 through stage 4 chronic kidney disease, or unspecified chronic kidney disease: Secondary | ICD-10-CM | POA: Diagnosis not present

## 2020-07-15 DIAGNOSIS — M25561 Pain in right knee: Secondary | ICD-10-CM | POA: Diagnosis not present

## 2020-07-15 DIAGNOSIS — Z5181 Encounter for therapeutic drug level monitoring: Secondary | ICD-10-CM | POA: Diagnosis not present

## 2020-07-15 DIAGNOSIS — M25562 Pain in left knee: Secondary | ICD-10-CM | POA: Diagnosis not present

## 2020-07-25 ENCOUNTER — Other Ambulatory Visit (HOSPITAL_COMMUNITY): Payer: Self-pay | Admitting: Internal Medicine

## 2020-07-25 MED FILL — ALLOPURINOL 300 MG TABS: 300 | 90 days supply | Qty: 90 | Fill #0

## 2020-07-25 MED FILL — POTASSIUM CHLORIDE CRYS ER: 20 | 90 days supply | Qty: 90 | Fill #2

## 2020-07-25 MED FILL — METOPROLOL SUCCINATE ER 100: 100 | 90 days supply | Qty: 90 | Fill #1

## 2020-07-30 DIAGNOSIS — N179 Acute kidney failure, unspecified: Secondary | ICD-10-CM | POA: Diagnosis not present

## 2020-08-25 ENCOUNTER — Other Ambulatory Visit (HOSPITAL_COMMUNITY): Payer: Self-pay | Admitting: Family Medicine

## 2020-08-25 MED FILL — predniSONE 20 MG TABS: 20 | 5 days supply | Qty: 10 | Fill #0

## 2020-08-25 MED FILL — AZITHROMYCIN 250 MG TABLET: 250 | 5 days supply | Qty: 6 | Fill #0

## 2020-08-25 MED FILL — BENZONATATE 100 MG CAPS: 100 | 15 days supply | Qty: 45 | Fill #0

## 2020-08-28 ENCOUNTER — Other Ambulatory Visit (HOSPITAL_COMMUNITY): Payer: Self-pay | Admitting: Family Medicine

## 2020-08-28 MED FILL — VENTOLIN HFA 90 MCG INHALER: 108 (90 BAS | 16 days supply | Qty: 18 | Fill #0

## 2020-09-01 MED FILL — CHLORTHALIDONE 25 MG TABS: 25 | 90 days supply | Qty: 90 | Fill #2

## 2020-09-25 MED FILL — AMLODIPINE-VALSARTAN 5-320: 5-320 | 90 days supply | Qty: 90 | Fill #1

## 2020-09-25 MED FILL — MELOXICAM 15 MG TABLET: 15 | 90 days supply | Qty: 90 | Fill #1

## 2020-10-16 ENCOUNTER — Other Ambulatory Visit (HOSPITAL_COMMUNITY): Payer: Self-pay | Admitting: Family Medicine

## 2020-10-16 MED FILL — CLINDAMYCIN PHOSPHATE 1 % G: 1 | 30 days supply | Qty: 30 | Fill #0

## 2020-10-24 ENCOUNTER — Other Ambulatory Visit (HOSPITAL_COMMUNITY): Payer: Self-pay | Admitting: Internal Medicine

## 2020-10-24 MED FILL — ALLOPURINOL 300 MG TABS: 300 | 90 days supply | Qty: 90 | Fill #1

## 2020-10-24 MED FILL — POTASSIUM CHLORIDE CRYS ER: 20 | 90 days supply | Qty: 90 | Fill #3

## 2020-10-30 ENCOUNTER — Other Ambulatory Visit (HOSPITAL_COMMUNITY): Payer: Self-pay | Admitting: Family Medicine

## 2020-10-30 MED FILL — METOPROLOL SUCCINATE ER 100: 100 | 90 days supply | Qty: 90 | Fill #0

## 2020-11-10 DIAGNOSIS — Z85828 Personal history of other malignant neoplasm of skin: Secondary | ICD-10-CM | POA: Diagnosis not present

## 2020-11-10 DIAGNOSIS — L570 Actinic keratosis: Secondary | ICD-10-CM | POA: Diagnosis not present

## 2020-11-10 DIAGNOSIS — D225 Melanocytic nevi of trunk: Secondary | ICD-10-CM | POA: Diagnosis not present

## 2020-11-10 DIAGNOSIS — L814 Other melanin hyperpigmentation: Secondary | ICD-10-CM | POA: Diagnosis not present

## 2020-11-10 DIAGNOSIS — L821 Other seborrheic keratosis: Secondary | ICD-10-CM | POA: Diagnosis not present

## 2020-11-18 DIAGNOSIS — I129 Hypertensive chronic kidney disease with stage 1 through stage 4 chronic kidney disease, or unspecified chronic kidney disease: Secondary | ICD-10-CM | POA: Diagnosis not present

## 2020-12-01 ENCOUNTER — Other Ambulatory Visit (HOSPITAL_COMMUNITY): Payer: Self-pay | Admitting: Family Medicine

## 2020-12-02 MED FILL — CHLORTHALIDONE 25 MG TABS: 25 | 90 days supply | Qty: 90 | Fill #0

## 2020-12-05 ENCOUNTER — Other Ambulatory Visit (HOSPITAL_BASED_OUTPATIENT_CLINIC_OR_DEPARTMENT_OTHER): Payer: Self-pay

## 2020-12-29 ENCOUNTER — Other Ambulatory Visit (HOSPITAL_COMMUNITY): Payer: Self-pay

## 2021-01-01 ENCOUNTER — Other Ambulatory Visit (HOSPITAL_COMMUNITY): Payer: Self-pay

## 2021-01-02 ENCOUNTER — Other Ambulatory Visit: Payer: Self-pay

## 2021-01-02 ENCOUNTER — Other Ambulatory Visit (HOSPITAL_COMMUNITY): Payer: Self-pay

## 2021-01-02 MED ORDER — AMLODIPINE BESYLATE-VALSARTAN 5-320 MG PO TABS: ORAL_TABLET | ORAL | 3 refills | Status: AC

## 2021-01-02 MED ORDER — AMLODIPINE BESYLATE-VALSARTAN 5-320 MG PO TABS: ORAL_TABLET | ORAL | 1 refills | Status: AC

## 2021-01-02 MED ORDER — AMLODIPINE BESYLATE-VALSARTAN 5-320 MG PO TABS
1.0000 | ORAL_TABLET | Freq: Every day | ORAL | 1 refills | Status: AC
Start: 2021-01-02 — End: 2022-01-02
  Filled 2021-01-02: qty 90, 90d supply, fill #0
  Filled 2021-03-27: qty 90, 90d supply, fill #1

## 2021-01-02 NOTE — Progress Notes (Signed)
Per Dr. Rhea Belton okay to refill.

## 2021-01-07 ENCOUNTER — Other Ambulatory Visit (HOSPITAL_COMMUNITY): Payer: Self-pay

## 2021-01-07 MED ORDER — PREDNISONE 20 MG PO TABS
40.0000 mg | ORAL_TABLET | Freq: Every morning | ORAL | 0 refills | Status: AC
  Filled 2021-01-07: qty 10, 5d supply, fill #0

## 2021-01-19 ENCOUNTER — Other Ambulatory Visit (HOSPITAL_COMMUNITY): Payer: Self-pay

## 2021-01-19 MED FILL — Allopurinol Tab 300 MG: ORAL | 90 days supply | Qty: 90 | Fill #0 | Status: AC

## 2021-01-19 MED FILL — Metoprolol Succinate Tab ER 24HR 100 MG (Tartrate Equiv): ORAL | 90 days supply | Qty: 90 | Fill #0 | Status: AC

## 2021-01-20 ENCOUNTER — Other Ambulatory Visit (HOSPITAL_COMMUNITY): Payer: Self-pay

## 2021-01-20 MED ORDER — POTASSIUM CHLORIDE CRYS ER 20 MEQ PO TBCR
20.0000 meq | EXTENDED_RELEASE_TABLET | Freq: Every day | ORAL | 3 refills | Status: AC
  Filled 2021-01-20: qty 90, 90d supply, fill #0
  Filled 2021-04-19: qty 90, 90d supply, fill #1
  Filled 2021-07-20: qty 90, 90d supply, fill #2
  Filled 2021-10-13: qty 90, 90d supply, fill #3

## 2021-03-03 ENCOUNTER — Other Ambulatory Visit (HOSPITAL_BASED_OUTPATIENT_CLINIC_OR_DEPARTMENT_OTHER): Payer: Self-pay

## 2021-03-03 ENCOUNTER — Other Ambulatory Visit (HOSPITAL_COMMUNITY): Payer: Self-pay

## 2021-03-03 MED FILL — Chlorthalidone Tab 25 MG: ORAL | 90 days supply | Qty: 90 | Fill #0 | Status: AC

## 2021-03-27 ENCOUNTER — Other Ambulatory Visit (HOSPITAL_COMMUNITY): Payer: Self-pay

## 2021-03-30 ENCOUNTER — Other Ambulatory Visit (HOSPITAL_COMMUNITY): Payer: Self-pay

## 2021-04-02 ENCOUNTER — Other Ambulatory Visit (HOSPITAL_COMMUNITY): Payer: Self-pay

## 2021-04-02 MED ORDER — MELOXICAM 15 MG PO TABS
ORAL_TABLET | ORAL | 1 refills | Status: AC
  Filled 2021-04-02: qty 90, 90d supply, fill #0
  Filled 2021-08-17: qty 90, 90d supply, fill #1

## 2021-04-19 ENCOUNTER — Other Ambulatory Visit (HOSPITAL_COMMUNITY): Payer: Self-pay

## 2021-04-19 MED FILL — Allopurinol Tab 300 MG: ORAL | 90 days supply | Qty: 90 | Fill #1 | Status: AC

## 2021-04-20 ENCOUNTER — Other Ambulatory Visit (HOSPITAL_COMMUNITY): Payer: Self-pay

## 2021-04-21 ENCOUNTER — Other Ambulatory Visit (HOSPITAL_COMMUNITY): Payer: Self-pay

## 2021-04-21 MED ORDER — METOPROLOL SUCCINATE ER 100 MG PO TB24
ORAL_TABLET | ORAL | 1 refills | Status: AC
  Filled 2021-04-21: qty 90, 90d supply, fill #0

## 2021-06-01 ENCOUNTER — Other Ambulatory Visit (HOSPITAL_COMMUNITY): Payer: Self-pay

## 2021-06-01 MED FILL — Chlorthalidone Tab 25 MG: ORAL | 90 days supply | Qty: 90 | Fill #1 | Status: AC

## 2021-06-05 DIAGNOSIS — Z13220 Encounter for screening for lipoid disorders: Secondary | ICD-10-CM | POA: Diagnosis not present

## 2021-06-05 DIAGNOSIS — Z23 Encounter for immunization: Secondary | ICD-10-CM | POA: Diagnosis not present

## 2021-06-05 DIAGNOSIS — N1831 Chronic kidney disease, stage 3a: Secondary | ICD-10-CM | POA: Diagnosis not present

## 2021-06-05 DIAGNOSIS — M1A09X0 Idiopathic chronic gout, multiple sites, without tophus (tophi): Secondary | ICD-10-CM | POA: Diagnosis not present

## 2021-06-05 DIAGNOSIS — Z125 Encounter for screening for malignant neoplasm of prostate: Secondary | ICD-10-CM | POA: Diagnosis not present

## 2021-06-05 DIAGNOSIS — I129 Hypertensive chronic kidney disease with stage 1 through stage 4 chronic kidney disease, or unspecified chronic kidney disease: Secondary | ICD-10-CM | POA: Diagnosis not present

## 2021-06-05 DIAGNOSIS — Z131 Encounter for screening for diabetes mellitus: Secondary | ICD-10-CM | POA: Diagnosis not present

## 2021-06-05 DIAGNOSIS — G4733 Obstructive sleep apnea (adult) (pediatric): Secondary | ICD-10-CM | POA: Diagnosis not present

## 2021-06-05 DIAGNOSIS — K219 Gastro-esophageal reflux disease without esophagitis: Secondary | ICD-10-CM | POA: Diagnosis not present

## 2021-06-29 ENCOUNTER — Other Ambulatory Visit (HOSPITAL_COMMUNITY): Payer: Self-pay

## 2021-06-29 MED ORDER — AMLODIPINE BESYLATE-VALSARTAN 5-320 MG PO TABS
ORAL_TABLET | ORAL | 1 refills | Status: AC
  Filled 2021-06-29: qty 90, 90d supply, fill #0
  Filled 2021-09-27: qty 90, 90d supply, fill #1

## 2021-06-30 ENCOUNTER — Other Ambulatory Visit (HOSPITAL_COMMUNITY): Payer: Self-pay

## 2021-07-01 ENCOUNTER — Other Ambulatory Visit (HOSPITAL_COMMUNITY): Payer: Self-pay

## 2021-07-20 ENCOUNTER — Other Ambulatory Visit (HOSPITAL_COMMUNITY): Payer: Self-pay

## 2021-07-20 MED ORDER — METOPROLOL SUCCINATE ER 100 MG PO TB24
ORAL_TABLET | ORAL | 1 refills | Status: AC
  Filled 2021-07-20: qty 90, 90d supply, fill #0
  Filled 2021-10-20: qty 90, 90d supply, fill #1

## 2021-07-20 MED ORDER — OMEPRAZOLE 20 MG PO CPDR
DELAYED_RELEASE_CAPSULE | ORAL | 1 refills | Status: AC
  Filled 2021-07-20: qty 90, 90d supply, fill #0
  Filled 2021-10-12: qty 90, 90d supply, fill #1

## 2021-07-21 ENCOUNTER — Other Ambulatory Visit (HOSPITAL_COMMUNITY): Payer: Self-pay

## 2021-07-21 MED ORDER — INSULIN PEN NEEDLE 32G X 6 MM MISC
0 refills | Status: AC
  Filled 2021-07-21 – 2021-10-07 (×2): qty 100, 90d supply, fill #0

## 2021-07-21 MED ORDER — SAXENDA 18 MG/3ML ~~LOC~~ SOPN
PEN_INJECTOR | SUBCUTANEOUS | 0 refills | Status: DC
  Filled 2021-07-21: qty 15, 30d supply, fill #0

## 2021-07-21 MED FILL — Allopurinol Tab 300 MG: ORAL | 90 days supply | Qty: 90 | Fill #0 | Status: AC

## 2021-07-24 ENCOUNTER — Other Ambulatory Visit (HOSPITAL_COMMUNITY): Payer: Self-pay

## 2021-07-25 ENCOUNTER — Other Ambulatory Visit (HOSPITAL_COMMUNITY): Payer: Self-pay

## 2021-07-28 ENCOUNTER — Other Ambulatory Visit (HOSPITAL_COMMUNITY): Payer: Self-pay

## 2021-07-28 MED ORDER — AMOXICILLIN-POT CLAVULANATE 875-125 MG PO TABS
ORAL_TABLET | ORAL | 0 refills | Status: AC
  Filled 2021-07-28: qty 20, 10d supply, fill #0

## 2021-07-30 ENCOUNTER — Other Ambulatory Visit (HOSPITAL_COMMUNITY): Payer: Self-pay

## 2021-07-30 MED ORDER — PREDNISONE 20 MG PO TABS
ORAL_TABLET | ORAL | 0 refills | Status: AC
  Filled 2021-07-30: qty 10, 5d supply, fill #0

## 2021-08-04 ENCOUNTER — Other Ambulatory Visit (HOSPITAL_COMMUNITY): Payer: Self-pay

## 2021-08-17 ENCOUNTER — Other Ambulatory Visit (HOSPITAL_COMMUNITY): Payer: Self-pay

## 2021-08-31 ENCOUNTER — Other Ambulatory Visit (HOSPITAL_COMMUNITY): Payer: Self-pay

## 2021-09-01 ENCOUNTER — Other Ambulatory Visit (HOSPITAL_COMMUNITY): Payer: Self-pay

## 2021-09-01 MED ORDER — CHLORTHALIDONE 25 MG PO TABS
ORAL_TABLET | ORAL | 2 refills | Status: DC
  Filled 2021-09-01: qty 90, 90d supply, fill #0
  Filled 2021-11-26: qty 90, 90d supply, fill #1
  Filled 2022-02-26: qty 90, 90d supply, fill #2

## 2021-09-09 ENCOUNTER — Other Ambulatory Visit (HOSPITAL_COMMUNITY): Payer: Self-pay

## 2021-09-28 ENCOUNTER — Other Ambulatory Visit (HOSPITAL_COMMUNITY): Payer: Self-pay

## 2021-09-29 ENCOUNTER — Other Ambulatory Visit (HOSPITAL_COMMUNITY): Payer: Self-pay

## 2021-10-05 ENCOUNTER — Other Ambulatory Visit (HOSPITAL_COMMUNITY): Payer: Self-pay

## 2021-10-07 ENCOUNTER — Other Ambulatory Visit (HOSPITAL_COMMUNITY): Payer: Self-pay

## 2021-10-07 MED ORDER — SAXENDA 18 MG/3ML ~~LOC~~ SOPN
PEN_INJECTOR | SUBCUTANEOUS | 2 refills | Status: AC
  Filled 2021-10-07: qty 15, 30d supply, fill #0

## 2021-10-08 ENCOUNTER — Other Ambulatory Visit (HOSPITAL_COMMUNITY): Payer: Self-pay

## 2021-10-12 ENCOUNTER — Other Ambulatory Visit (HOSPITAL_COMMUNITY): Payer: Self-pay

## 2021-10-13 ENCOUNTER — Other Ambulatory Visit (HOSPITAL_COMMUNITY): Payer: Self-pay

## 2021-10-20 MED FILL — Allopurinol Tab 300 MG: ORAL | 90 days supply | Qty: 90 | Fill #1 | Status: AC

## 2021-10-21 ENCOUNTER — Other Ambulatory Visit (HOSPITAL_COMMUNITY): Payer: Self-pay

## 2021-11-10 DIAGNOSIS — Z85828 Personal history of other malignant neoplasm of skin: Secondary | ICD-10-CM | POA: Diagnosis not present

## 2021-11-10 DIAGNOSIS — L814 Other melanin hyperpigmentation: Secondary | ICD-10-CM | POA: Diagnosis not present

## 2021-11-10 DIAGNOSIS — L821 Other seborrheic keratosis: Secondary | ICD-10-CM | POA: Diagnosis not present

## 2021-11-10 DIAGNOSIS — D225 Melanocytic nevi of trunk: Secondary | ICD-10-CM | POA: Diagnosis not present

## 2021-11-16 ENCOUNTER — Other Ambulatory Visit (HOSPITAL_COMMUNITY): Payer: Self-pay

## 2021-11-16 MED ORDER — WEGOVY 0.5 MG/0.5ML ~~LOC~~ SOAJ
SUBCUTANEOUS | 0 refills | Status: AC
  Filled 2021-11-16 – 2021-11-18 (×2): qty 2, 28d supply, fill #0

## 2021-11-18 ENCOUNTER — Other Ambulatory Visit (HOSPITAL_COMMUNITY): Payer: Self-pay

## 2021-11-24 ENCOUNTER — Other Ambulatory Visit (HOSPITAL_COMMUNITY): Payer: Self-pay

## 2021-11-24 MED ORDER — PREDNISONE 20 MG PO TABS
ORAL_TABLET | ORAL | 0 refills | Status: AC
  Filled 2021-11-24: qty 10, 5d supply, fill #0

## 2021-11-26 ENCOUNTER — Other Ambulatory Visit (HOSPITAL_COMMUNITY): Payer: Self-pay

## 2021-12-03 ENCOUNTER — Other Ambulatory Visit (HOSPITAL_COMMUNITY): Payer: Self-pay

## 2021-12-03 MED ORDER — WEGOVY 2.4 MG/0.75ML ~~LOC~~ SOAJ
SUBCUTANEOUS | 3 refills | Status: DC
  Filled 2021-12-03: qty 3, 28d supply, fill #0
  Filled 2022-01-04: qty 3, 28d supply, fill #1
  Filled 2022-01-29: qty 3, 28d supply, fill #2
  Filled 2022-02-26: qty 3, 28d supply, fill #3

## 2021-12-15 DIAGNOSIS — M1A09X0 Idiopathic chronic gout, multiple sites, without tophus (tophi): Secondary | ICD-10-CM | POA: Diagnosis not present

## 2021-12-15 DIAGNOSIS — N1831 Chronic kidney disease, stage 3a: Secondary | ICD-10-CM | POA: Diagnosis not present

## 2021-12-15 DIAGNOSIS — Z125 Encounter for screening for malignant neoplasm of prostate: Secondary | ICD-10-CM | POA: Diagnosis not present

## 2021-12-15 DIAGNOSIS — G4733 Obstructive sleep apnea (adult) (pediatric): Secondary | ICD-10-CM | POA: Diagnosis not present

## 2021-12-15 DIAGNOSIS — I129 Hypertensive chronic kidney disease with stage 1 through stage 4 chronic kidney disease, or unspecified chronic kidney disease: Secondary | ICD-10-CM | POA: Diagnosis not present

## 2021-12-22 ENCOUNTER — Other Ambulatory Visit (HOSPITAL_COMMUNITY): Payer: Self-pay

## 2021-12-22 MED ORDER — MELOXICAM 15 MG PO TABS
ORAL_TABLET | ORAL | 1 refills | Status: AC
  Filled 2021-12-22: qty 90, 90d supply, fill #0
  Filled 2022-03-25: qty 90, 90d supply, fill #1

## 2021-12-29 ENCOUNTER — Other Ambulatory Visit (HOSPITAL_COMMUNITY): Payer: Self-pay

## 2021-12-29 MED ORDER — AMLODIPINE BESYLATE-VALSARTAN 5-320 MG PO TABS
ORAL_TABLET | ORAL | 1 refills | Status: DC
  Filled 2021-12-29: qty 90, 90d supply, fill #0
  Filled 2022-03-25: qty 90, 90d supply, fill #1

## 2021-12-30 ENCOUNTER — Other Ambulatory Visit (HOSPITAL_COMMUNITY): Payer: Self-pay

## 2021-12-31 ENCOUNTER — Other Ambulatory Visit (HOSPITAL_COMMUNITY): Payer: Self-pay

## 2022-01-01 ENCOUNTER — Other Ambulatory Visit (HOSPITAL_COMMUNITY): Payer: Self-pay

## 2022-01-04 ENCOUNTER — Other Ambulatory Visit (HOSPITAL_COMMUNITY): Payer: Self-pay

## 2022-01-18 ENCOUNTER — Other Ambulatory Visit (HOSPITAL_COMMUNITY): Payer: Self-pay

## 2022-01-18 MED ORDER — POTASSIUM CHLORIDE CRYS ER 20 MEQ PO TBCR
EXTENDED_RELEASE_TABLET | ORAL | 1 refills | Status: DC
  Filled 2022-01-18: qty 90, 90d supply, fill #0
  Filled 2022-04-13: qty 90, 90d supply, fill #1

## 2022-01-21 ENCOUNTER — Other Ambulatory Visit (HOSPITAL_COMMUNITY): Payer: Self-pay

## 2022-01-21 MED ORDER — ALLOPURINOL 300 MG PO TABS
ORAL_TABLET | ORAL | 1 refills | Status: DC
  Filled 2022-01-21: qty 90, 90d supply, fill #0
  Filled 2022-04-20: qty 90, 90d supply, fill #1

## 2022-01-21 MED ORDER — METOPROLOL SUCCINATE ER 100 MG PO TB24
ORAL_TABLET | ORAL | 1 refills | Status: DC
  Filled 2022-01-21: qty 90, 90d supply, fill #0
  Filled 2022-04-20: qty 90, 90d supply, fill #1

## 2022-01-29 ENCOUNTER — Other Ambulatory Visit (HOSPITAL_COMMUNITY): Payer: Self-pay

## 2022-02-04 ENCOUNTER — Other Ambulatory Visit (HOSPITAL_COMMUNITY): Payer: Self-pay

## 2022-02-04 MED ORDER — OMEPRAZOLE 20 MG PO CPDR
DELAYED_RELEASE_CAPSULE | ORAL | 1 refills | Status: AC
Start: 2022-02-04 — End: ?
  Filled 2022-02-04: qty 90, 90d supply, fill #0
  Filled 2022-05-12: qty 90, 90d supply, fill #1

## 2022-02-15 ENCOUNTER — Other Ambulatory Visit (HOSPITAL_COMMUNITY): Payer: Self-pay

## 2022-02-15 MED ORDER — DOXYCYCLINE HYCLATE 100 MG PO CAPS
ORAL_CAPSULE | ORAL | 0 refills | Status: AC
  Filled 2022-02-15: qty 14, 7d supply, fill #0

## 2022-02-26 ENCOUNTER — Other Ambulatory Visit (HOSPITAL_COMMUNITY): Payer: Self-pay

## 2022-03-02 ENCOUNTER — Other Ambulatory Visit (HOSPITAL_COMMUNITY): Payer: Self-pay

## 2022-03-22 ENCOUNTER — Other Ambulatory Visit (HOSPITAL_COMMUNITY): Payer: Self-pay

## 2022-03-22 MED ORDER — POLYMYXIN B-TRIMETHOPRIM 10000-0.1 UNIT/ML-% OP SOLN
OPHTHALMIC | 0 refills | Status: AC
  Filled 2022-03-22: qty 10, 5d supply, fill #0

## 2022-03-22 MED ORDER — WEGOVY 2.4 MG/0.75ML ~~LOC~~ SOAJ
SUBCUTANEOUS | 3 refills | Status: DC
  Filled 2022-03-22: qty 3, 28d supply, fill #0
  Filled 2022-04-23: qty 3, 28d supply, fill #1
  Filled 2022-05-25: qty 3, 28d supply, fill #2
  Filled 2022-06-21: qty 3, 28d supply, fill #3

## 2022-03-25 ENCOUNTER — Other Ambulatory Visit (HOSPITAL_COMMUNITY): Payer: Self-pay

## 2022-03-25 MED ORDER — ERYTHROMYCIN 5 MG/GM OP OINT
TOPICAL_OINTMENT | OPHTHALMIC | 0 refills | Status: AC
  Filled 2022-03-25: qty 3.5, 10d supply, fill #0

## 2022-03-26 ENCOUNTER — Other Ambulatory Visit (HOSPITAL_COMMUNITY): Payer: Self-pay

## 2022-03-30 ENCOUNTER — Other Ambulatory Visit (HOSPITAL_COMMUNITY): Payer: Self-pay

## 2022-04-14 ENCOUNTER — Other Ambulatory Visit (HOSPITAL_COMMUNITY): Payer: Self-pay

## 2022-04-20 ENCOUNTER — Other Ambulatory Visit (HOSPITAL_COMMUNITY): Payer: Self-pay

## 2022-04-21 ENCOUNTER — Other Ambulatory Visit (HOSPITAL_COMMUNITY): Payer: Self-pay

## 2022-04-21 MED ORDER — CYCLOBENZAPRINE HCL 10 MG PO TABS
ORAL_TABLET | ORAL | 2 refills | Status: AC
  Filled 2022-04-21: qty 30, 10d supply, fill #0

## 2022-04-23 ENCOUNTER — Other Ambulatory Visit (HOSPITAL_COMMUNITY): Payer: Self-pay

## 2022-05-12 ENCOUNTER — Other Ambulatory Visit (HOSPITAL_COMMUNITY): Payer: Self-pay

## 2022-05-24 ENCOUNTER — Other Ambulatory Visit (HOSPITAL_COMMUNITY): Payer: Self-pay

## 2022-05-24 MED ORDER — CHLORTHALIDONE 25 MG PO TABS
25.0000 mg | ORAL_TABLET | Freq: Every morning | ORAL | 2 refills | Status: AC
  Filled 2022-05-24: qty 90, 90d supply, fill #0
  Filled 2022-08-23: qty 90, 90d supply, fill #1
  Filled 2022-11-13 (×2): qty 90, 90d supply, fill #2

## 2022-05-25 ENCOUNTER — Other Ambulatory Visit (HOSPITAL_COMMUNITY): Payer: Self-pay

## 2022-06-21 ENCOUNTER — Other Ambulatory Visit (HOSPITAL_COMMUNITY): Payer: Self-pay

## 2022-06-24 ENCOUNTER — Other Ambulatory Visit (HOSPITAL_COMMUNITY): Payer: Self-pay

## 2022-06-24 MED ORDER — AMLODIPINE BESYLATE-VALSARTAN 5-320 MG PO TABS
ORAL_TABLET | ORAL | 1 refills | Status: AC
  Filled 2022-06-24: qty 90, 90d supply, fill #0
  Filled 2022-09-17: qty 90, 90d supply, fill #1

## 2022-06-25 ENCOUNTER — Other Ambulatory Visit (HOSPITAL_COMMUNITY): Payer: Self-pay

## 2022-06-28 ENCOUNTER — Other Ambulatory Visit (HOSPITAL_COMMUNITY): Payer: Self-pay

## 2022-07-14 ENCOUNTER — Other Ambulatory Visit (HOSPITAL_COMMUNITY): Payer: Self-pay

## 2022-07-15 ENCOUNTER — Other Ambulatory Visit (HOSPITAL_COMMUNITY): Payer: Self-pay

## 2022-07-15 MED ORDER — POTASSIUM CHLORIDE CRYS ER 20 MEQ PO TBCR
20.0000 meq | EXTENDED_RELEASE_TABLET | Freq: Every day | ORAL | 1 refills | Status: DC
  Filled 2022-07-15: qty 90, 90d supply, fill #0
  Filled 2022-10-15: qty 90, 90d supply, fill #1

## 2022-07-20 ENCOUNTER — Other Ambulatory Visit (HOSPITAL_COMMUNITY): Payer: Self-pay

## 2022-07-20 DIAGNOSIS — N182 Chronic kidney disease, stage 2 (mild): Secondary | ICD-10-CM | POA: Diagnosis not present

## 2022-07-20 DIAGNOSIS — I129 Hypertensive chronic kidney disease with stage 1 through stage 4 chronic kidney disease, or unspecified chronic kidney disease: Secondary | ICD-10-CM | POA: Diagnosis not present

## 2022-07-20 MED ORDER — WEGOVY 2.4 MG/0.75ML ~~LOC~~ SOAJ
2.4000 mg | SUBCUTANEOUS | 3 refills | Status: DC
  Filled 2022-07-20: qty 3, 28d supply, fill #0
  Filled 2022-08-23: qty 3, 28d supply, fill #1
  Filled 2022-09-14: qty 3, 28d supply, fill #2
  Filled 2022-10-20: qty 3, 28d supply, fill #3

## 2022-07-21 ENCOUNTER — Other Ambulatory Visit (HOSPITAL_COMMUNITY): Payer: Self-pay

## 2022-07-21 MED ORDER — ALLOPURINOL 300 MG PO TABS
300.0000 mg | ORAL_TABLET | Freq: Every day | ORAL | 1 refills | Status: AC
  Filled 2022-07-21: qty 90, 90d supply, fill #0
  Filled 2022-10-15: qty 90, 90d supply, fill #1

## 2022-07-21 MED ORDER — METOPROLOL SUCCINATE ER 100 MG PO TB24
100.0000 mg | ORAL_TABLET | Freq: Every day | ORAL | 1 refills | Status: DC
  Filled 2022-07-21: qty 90, 90d supply, fill #0
  Filled 2022-10-21: qty 90, 90d supply, fill #1

## 2022-07-28 ENCOUNTER — Other Ambulatory Visit (HOSPITAL_COMMUNITY): Payer: Self-pay

## 2022-07-28 MED ORDER — MELOXICAM 15 MG PO TABS
15.0000 mg | ORAL_TABLET | Freq: Every day | ORAL | 1 refills | Status: AC
  Filled 2022-07-28: qty 90, 90d supply, fill #0
  Filled 2023-01-03: qty 90, 90d supply, fill #1

## 2022-08-11 ENCOUNTER — Other Ambulatory Visit (HOSPITAL_COMMUNITY): Payer: Self-pay

## 2022-08-11 MED ORDER — OMEPRAZOLE 20 MG PO CPDR
20.0000 mg | DELAYED_RELEASE_CAPSULE | Freq: Every day | ORAL | 1 refills | Status: AC
  Filled 2022-08-11: qty 90, 90d supply, fill #0
  Filled 2022-11-13: qty 90, 90d supply, fill #1

## 2022-08-17 ENCOUNTER — Other Ambulatory Visit (HOSPITAL_COMMUNITY): Payer: Self-pay

## 2022-08-18 ENCOUNTER — Other Ambulatory Visit (HOSPITAL_COMMUNITY): Payer: Self-pay

## 2022-08-20 ENCOUNTER — Other Ambulatory Visit (HOSPITAL_COMMUNITY): Payer: Self-pay

## 2022-08-23 ENCOUNTER — Other Ambulatory Visit (HOSPITAL_COMMUNITY): Payer: Self-pay

## 2022-09-14 ENCOUNTER — Other Ambulatory Visit (HOSPITAL_COMMUNITY): Payer: Self-pay

## 2022-09-17 ENCOUNTER — Other Ambulatory Visit (HOSPITAL_COMMUNITY): Payer: Self-pay

## 2022-10-15 ENCOUNTER — Other Ambulatory Visit: Payer: Self-pay

## 2022-10-20 ENCOUNTER — Other Ambulatory Visit: Payer: Self-pay

## 2022-10-21 ENCOUNTER — Other Ambulatory Visit (HOSPITAL_COMMUNITY): Payer: Self-pay

## 2022-11-01 ENCOUNTER — Other Ambulatory Visit (HOSPITAL_COMMUNITY): Payer: Self-pay

## 2022-11-01 MED ORDER — CYCLOBENZAPRINE HCL 10 MG PO TABS
10.0000 mg | ORAL_TABLET | Freq: Three times a day (TID) | ORAL | 2 refills | Status: AC | PRN
  Filled 2022-11-01 (×2): qty 30, 10d supply, fill #0

## 2022-11-01 MED ORDER — DICLOFENAC POTASSIUM 50 MG PO TABS
50.0000 mg | ORAL_TABLET | Freq: Two times a day (BID) | ORAL | 0 refills | Status: AC | PRN
  Filled 2022-11-01 (×2): qty 30, 15d supply, fill #0

## 2022-11-08 ENCOUNTER — Other Ambulatory Visit (HOSPITAL_COMMUNITY): Payer: Self-pay

## 2022-11-08 MED ORDER — WEGOVY 2.4 MG/0.75ML ~~LOC~~ SOAJ
2.4000 mg | SUBCUTANEOUS | 3 refills | Status: AC
  Filled 2022-11-08 – 2022-11-17 (×2): qty 3, 28d supply, fill #0
  Filled 2022-12-06: qty 3, 28d supply, fill #1

## 2022-11-09 ENCOUNTER — Other Ambulatory Visit (HOSPITAL_COMMUNITY): Payer: Self-pay

## 2022-11-09 MED ORDER — SEMAGLUTIDE-WEIGHT MANAGEMENT 2.4 MG/0.75ML ~~LOC~~ SOAJ
2.4000 mg | SUBCUTANEOUS | 3 refills | Status: DC
  Filled 2022-11-09 – 2022-12-09 (×2): qty 3, 28d supply, fill #0
  Filled 2023-01-17 (×2): qty 3, 28d supply, fill #1
  Filled 2023-02-08: qty 3, 28d supply, fill #2
  Filled 2023-03-07 (×2): qty 3, 28d supply, fill #3

## 2022-11-13 ENCOUNTER — Other Ambulatory Visit (HOSPITAL_COMMUNITY): Payer: Self-pay

## 2022-11-15 ENCOUNTER — Other Ambulatory Visit (HOSPITAL_COMMUNITY): Payer: Self-pay

## 2022-11-16 DIAGNOSIS — D225 Melanocytic nevi of trunk: Secondary | ICD-10-CM | POA: Diagnosis not present

## 2022-11-16 DIAGNOSIS — L821 Other seborrheic keratosis: Secondary | ICD-10-CM | POA: Diagnosis not present

## 2022-11-16 DIAGNOSIS — L814 Other melanin hyperpigmentation: Secondary | ICD-10-CM | POA: Diagnosis not present

## 2022-11-16 DIAGNOSIS — Z85828 Personal history of other malignant neoplasm of skin: Secondary | ICD-10-CM | POA: Diagnosis not present

## 2022-11-17 ENCOUNTER — Other Ambulatory Visit (HOSPITAL_COMMUNITY): Payer: Self-pay

## 2022-12-02 ENCOUNTER — Other Ambulatory Visit (HOSPITAL_COMMUNITY): Payer: Self-pay

## 2022-12-02 ENCOUNTER — Other Ambulatory Visit: Payer: Self-pay

## 2022-12-02 MED ORDER — TRIAMCINOLONE ACETONIDE 0.1 % EX CREA
TOPICAL_CREAM | CUTANEOUS | 0 refills | Status: AC
  Filled 2022-12-02: qty 80, 30d supply, fill #0

## 2022-12-06 ENCOUNTER — Other Ambulatory Visit (HOSPITAL_COMMUNITY): Payer: Self-pay

## 2022-12-09 ENCOUNTER — Other Ambulatory Visit (HOSPITAL_COMMUNITY): Payer: Self-pay

## 2022-12-22 ENCOUNTER — Other Ambulatory Visit (HOSPITAL_COMMUNITY): Payer: Self-pay

## 2022-12-22 MED ORDER — AMLODIPINE BESYLATE-VALSARTAN 5-320 MG PO TABS
1.0000 | ORAL_TABLET | Freq: Every day | ORAL | 1 refills | Status: DC
  Filled 2022-12-22: qty 90, 90d supply, fill #0
  Filled 2023-03-22: qty 90, 90d supply, fill #1

## 2023-01-04 ENCOUNTER — Other Ambulatory Visit (HOSPITAL_COMMUNITY): Payer: Self-pay

## 2023-01-12 ENCOUNTER — Other Ambulatory Visit (HOSPITAL_COMMUNITY): Payer: Self-pay

## 2023-01-12 ENCOUNTER — Other Ambulatory Visit: Payer: Self-pay

## 2023-01-12 MED ORDER — POTASSIUM CHLORIDE CRYS ER 20 MEQ PO TBCR
20.0000 meq | EXTENDED_RELEASE_TABLET | Freq: Every day | ORAL | 1 refills | Status: DC
  Filled 2023-01-12: qty 90, 90d supply, fill #0
  Filled 2023-04-13: qty 90, 90d supply, fill #1

## 2023-01-17 ENCOUNTER — Other Ambulatory Visit (HOSPITAL_COMMUNITY): Payer: Self-pay

## 2023-01-18 ENCOUNTER — Other Ambulatory Visit (HOSPITAL_COMMUNITY): Payer: Self-pay

## 2023-01-19 ENCOUNTER — Other Ambulatory Visit (HOSPITAL_COMMUNITY): Payer: Self-pay

## 2023-01-19 MED ORDER — METOPROLOL SUCCINATE ER 100 MG PO TB24
100.0000 mg | ORAL_TABLET | Freq: Every day | ORAL | 1 refills | Status: DC
  Filled 2023-01-19: qty 90, 90d supply, fill #0
  Filled 2023-04-25: qty 90, 90d supply, fill #1

## 2023-01-19 MED ORDER — ALLOPURINOL 300 MG PO TABS
300.0000 mg | ORAL_TABLET | Freq: Every day | ORAL | 1 refills | Status: DC
  Filled 2023-01-19: qty 90, 90d supply, fill #0
  Filled 2023-04-19: qty 90, 90d supply, fill #1

## 2023-02-08 ENCOUNTER — Other Ambulatory Visit (HOSPITAL_COMMUNITY): Payer: Self-pay

## 2023-02-08 ENCOUNTER — Other Ambulatory Visit: Payer: Self-pay

## 2023-02-16 ENCOUNTER — Other Ambulatory Visit: Payer: Self-pay

## 2023-02-16 ENCOUNTER — Other Ambulatory Visit (HOSPITAL_COMMUNITY): Payer: Self-pay

## 2023-02-16 MED ORDER — OMEPRAZOLE 20 MG PO CPDR
20.0000 mg | DELAYED_RELEASE_CAPSULE | Freq: Every day | ORAL | 1 refills | Status: DC
  Filled 2023-02-16: qty 90, 90d supply, fill #0
  Filled 2023-12-08: qty 90, 90d supply, fill #1

## 2023-02-22 ENCOUNTER — Other Ambulatory Visit: Payer: Self-pay

## 2023-02-22 ENCOUNTER — Other Ambulatory Visit (HOSPITAL_COMMUNITY): Payer: Self-pay

## 2023-02-22 MED ORDER — CHLORTHALIDONE 25 MG PO TABS
25.0000 mg | ORAL_TABLET | Freq: Every morning | ORAL | 2 refills | Status: AC
  Filled 2023-02-22: qty 90, 90d supply, fill #0
  Filled 2023-05-24: qty 90, 90d supply, fill #1

## 2023-03-07 ENCOUNTER — Other Ambulatory Visit (HOSPITAL_COMMUNITY): Payer: Self-pay

## 2023-03-23 ENCOUNTER — Other Ambulatory Visit (HOSPITAL_COMMUNITY): Payer: Self-pay

## 2023-04-06 ENCOUNTER — Other Ambulatory Visit (HOSPITAL_COMMUNITY): Payer: Self-pay

## 2023-04-06 MED ORDER — WEGOVY 2.4 MG/0.75ML ~~LOC~~ SOAJ
2.4000 mg | SUBCUTANEOUS | 3 refills | Status: DC
  Filled 2023-04-06 – 2023-04-15 (×4): qty 3, 28d supply, fill #0
  Filled 2023-05-05: qty 3, 28d supply, fill #1
  Filled 2023-06-06: qty 3, 28d supply, fill #2
  Filled 2023-06-29 – 2023-07-01 (×2): qty 3, 28d supply, fill #3

## 2023-04-07 ENCOUNTER — Other Ambulatory Visit (HOSPITAL_COMMUNITY): Payer: Self-pay

## 2023-04-11 ENCOUNTER — Other Ambulatory Visit (HOSPITAL_COMMUNITY): Payer: Self-pay

## 2023-04-11 ENCOUNTER — Other Ambulatory Visit: Payer: Self-pay

## 2023-04-12 ENCOUNTER — Other Ambulatory Visit (HOSPITAL_COMMUNITY): Payer: Self-pay

## 2023-04-13 ENCOUNTER — Other Ambulatory Visit (HOSPITAL_COMMUNITY): Payer: Self-pay

## 2023-04-13 ENCOUNTER — Other Ambulatory Visit: Payer: Self-pay

## 2023-04-14 ENCOUNTER — Other Ambulatory Visit (HOSPITAL_COMMUNITY): Payer: Self-pay

## 2023-04-15 ENCOUNTER — Other Ambulatory Visit (HOSPITAL_COMMUNITY): Payer: Self-pay

## 2023-04-15 ENCOUNTER — Other Ambulatory Visit: Payer: Self-pay

## 2023-04-19 ENCOUNTER — Other Ambulatory Visit (HOSPITAL_COMMUNITY): Payer: Self-pay

## 2023-04-20 ENCOUNTER — Other Ambulatory Visit (HOSPITAL_COMMUNITY): Payer: Self-pay

## 2023-04-25 ENCOUNTER — Other Ambulatory Visit (HOSPITAL_COMMUNITY): Payer: Self-pay

## 2023-05-05 ENCOUNTER — Other Ambulatory Visit (HOSPITAL_COMMUNITY): Payer: Self-pay

## 2023-05-25 ENCOUNTER — Other Ambulatory Visit (HOSPITAL_COMMUNITY): Payer: Self-pay

## 2023-06-01 ENCOUNTER — Other Ambulatory Visit: Payer: Self-pay

## 2023-06-01 ENCOUNTER — Other Ambulatory Visit (HOSPITAL_COMMUNITY): Payer: Self-pay

## 2023-06-01 MED ORDER — MELOXICAM 15 MG PO TABS
15.0000 mg | ORAL_TABLET | Freq: Every day | ORAL | 1 refills | Status: DC
  Filled 2023-06-01: qty 90, 90d supply, fill #0
  Filled 2023-10-06 – 2023-10-07 (×2): qty 90, 90d supply, fill #1

## 2023-06-06 ENCOUNTER — Other Ambulatory Visit (HOSPITAL_COMMUNITY): Payer: Self-pay

## 2023-06-06 ENCOUNTER — Other Ambulatory Visit: Payer: Self-pay

## 2023-06-07 ENCOUNTER — Other Ambulatory Visit (HOSPITAL_COMMUNITY): Payer: Self-pay

## 2023-06-15 ENCOUNTER — Other Ambulatory Visit (HOSPITAL_COMMUNITY): Payer: Self-pay

## 2023-06-15 MED ORDER — AMLODIPINE BESYLATE-VALSARTAN 5-320 MG PO TABS
1.0000 | ORAL_TABLET | Freq: Every day | ORAL | 1 refills | Status: DC
  Filled 2023-06-15: qty 90, 90d supply, fill #0
  Filled 2023-09-12: qty 90, 90d supply, fill #1

## 2023-06-16 ENCOUNTER — Other Ambulatory Visit: Payer: Self-pay

## 2023-06-28 ENCOUNTER — Other Ambulatory Visit (HOSPITAL_COMMUNITY): Payer: Self-pay

## 2023-06-28 ENCOUNTER — Other Ambulatory Visit: Payer: Self-pay

## 2023-06-28 MED ORDER — PREDNISONE 20 MG PO TABS
40.0000 mg | ORAL_TABLET | Freq: Every day | ORAL | 0 refills | Status: AC
  Filled 2023-06-28 (×3): qty 10, 5d supply, fill #0

## 2023-06-29 ENCOUNTER — Other Ambulatory Visit (HOSPITAL_COMMUNITY): Payer: Self-pay

## 2023-07-05 ENCOUNTER — Other Ambulatory Visit (HOSPITAL_COMMUNITY): Payer: Self-pay

## 2023-07-05 MED ORDER — POTASSIUM CHLORIDE CRYS ER 20 MEQ PO TBCR
20.0000 meq | EXTENDED_RELEASE_TABLET | Freq: Every day | ORAL | 1 refills | Status: DC
  Filled 2023-07-05: qty 90, 90d supply, fill #0
  Filled 2023-10-06 – 2023-10-07 (×2): qty 90, 90d supply, fill #1

## 2023-07-12 ENCOUNTER — Other Ambulatory Visit (HOSPITAL_COMMUNITY): Payer: Self-pay

## 2023-07-12 MED ORDER — ALLOPURINOL 300 MG PO TABS
300.0000 mg | ORAL_TABLET | Freq: Every day | ORAL | 1 refills | Status: DC
  Filled 2023-07-12: qty 90, 90d supply, fill #0
  Filled 2023-10-06 – 2023-10-07 (×2): qty 90, 90d supply, fill #1

## 2023-07-13 ENCOUNTER — Other Ambulatory Visit (HOSPITAL_COMMUNITY): Payer: Self-pay

## 2023-07-21 ENCOUNTER — Other Ambulatory Visit: Payer: Self-pay

## 2023-07-21 ENCOUNTER — Other Ambulatory Visit (HOSPITAL_COMMUNITY): Payer: Self-pay

## 2023-07-21 MED ORDER — METOPROLOL SUCCINATE ER 100 MG PO TB24
100.0000 mg | ORAL_TABLET | Freq: Every day | ORAL | 1 refills | Status: DC
  Filled 2023-07-21: qty 90, 90d supply, fill #0
  Filled 2023-10-21: qty 90, 90d supply, fill #1

## 2023-08-01 ENCOUNTER — Other Ambulatory Visit (HOSPITAL_COMMUNITY): Payer: Self-pay

## 2023-08-02 ENCOUNTER — Other Ambulatory Visit (HOSPITAL_COMMUNITY): Payer: Self-pay

## 2023-08-02 DIAGNOSIS — Z0000 Encounter for general adult medical examination without abnormal findings: Secondary | ICD-10-CM | POA: Diagnosis not present

## 2023-08-02 MED ORDER — SEMAGLUTIDE-WEIGHT MANAGEMENT 2.4 MG/0.75ML ~~LOC~~ SOAJ
2.4000 mg | SUBCUTANEOUS | 3 refills | Status: DC
  Filled 2023-08-02 – 2023-08-08 (×2): qty 3, 28d supply, fill #0
  Filled 2023-09-13: qty 3, 28d supply, fill #1
  Filled 2023-10-07 (×2): qty 3, 28d supply, fill #2
  Filled 2023-11-24: qty 3, 28d supply, fill #3

## 2023-08-08 ENCOUNTER — Other Ambulatory Visit (HOSPITAL_COMMUNITY): Payer: Self-pay

## 2023-08-17 ENCOUNTER — Other Ambulatory Visit (HOSPITAL_COMMUNITY): Payer: Self-pay

## 2023-08-17 ENCOUNTER — Other Ambulatory Visit: Payer: Self-pay

## 2023-08-17 MED ORDER — CHLORTHALIDONE 25 MG PO TABS
25.0000 mg | ORAL_TABLET | Freq: Every day | ORAL | 2 refills | Status: DC
  Filled 2023-08-17: qty 90, 90d supply, fill #0
  Filled 2023-11-04: qty 90, 90d supply, fill #1
  Filled 2024-02-21: qty 90, 90d supply, fill #2

## 2023-09-12 ENCOUNTER — Other Ambulatory Visit (HOSPITAL_COMMUNITY): Payer: Self-pay

## 2023-09-13 ENCOUNTER — Other Ambulatory Visit (HOSPITAL_COMMUNITY): Payer: Self-pay

## 2023-10-06 ENCOUNTER — Other Ambulatory Visit (HOSPITAL_COMMUNITY): Payer: Self-pay

## 2023-10-07 ENCOUNTER — Other Ambulatory Visit (HOSPITAL_COMMUNITY): Payer: Self-pay

## 2023-10-07 ENCOUNTER — Other Ambulatory Visit: Payer: Self-pay

## 2023-10-21 ENCOUNTER — Other Ambulatory Visit (HOSPITAL_COMMUNITY): Payer: Self-pay

## 2023-11-01 ENCOUNTER — Other Ambulatory Visit: Payer: Self-pay

## 2023-11-01 ENCOUNTER — Other Ambulatory Visit (HOSPITAL_COMMUNITY): Payer: Self-pay

## 2023-11-01 MED ORDER — OSELTAMIVIR PHOSPHATE 75 MG PO CAPS
75.0000 mg | ORAL_CAPSULE | Freq: Two times a day (BID) | ORAL | 0 refills | Status: AC
  Filled 2023-11-01 (×2): qty 10, 5d supply, fill #0

## 2023-11-04 ENCOUNTER — Other Ambulatory Visit (HOSPITAL_COMMUNITY): Payer: Self-pay

## 2023-11-24 ENCOUNTER — Other Ambulatory Visit: Payer: Self-pay

## 2023-11-24 ENCOUNTER — Other Ambulatory Visit (HOSPITAL_COMMUNITY): Payer: Self-pay

## 2023-11-24 MED ORDER — ALBUTEROL SULFATE HFA 108 (90 BASE) MCG/ACT IN AERS
2.0000 | INHALATION_SPRAY | RESPIRATORY_TRACT | 2 refills | Status: AC
  Filled 2023-11-24: qty 18, 17d supply, fill #0

## 2023-11-25 ENCOUNTER — Other Ambulatory Visit (HOSPITAL_COMMUNITY): Payer: Self-pay

## 2023-12-06 DIAGNOSIS — D225 Melanocytic nevi of trunk: Secondary | ICD-10-CM | POA: Diagnosis not present

## 2023-12-06 DIAGNOSIS — L814 Other melanin hyperpigmentation: Secondary | ICD-10-CM | POA: Diagnosis not present

## 2023-12-06 DIAGNOSIS — L821 Other seborrheic keratosis: Secondary | ICD-10-CM | POA: Diagnosis not present

## 2023-12-06 DIAGNOSIS — Z85828 Personal history of other malignant neoplasm of skin: Secondary | ICD-10-CM | POA: Diagnosis not present

## 2023-12-08 ENCOUNTER — Other Ambulatory Visit (HOSPITAL_COMMUNITY): Payer: Self-pay

## 2023-12-09 ENCOUNTER — Other Ambulatory Visit (HOSPITAL_COMMUNITY): Payer: Self-pay

## 2023-12-13 ENCOUNTER — Other Ambulatory Visit (HOSPITAL_COMMUNITY): Payer: Self-pay

## 2023-12-13 MED ORDER — AMLODIPINE BESYLATE-VALSARTAN 5-320 MG PO TABS
1.0000 | ORAL_TABLET | Freq: Every day | ORAL | 1 refills | Status: DC
  Filled 2023-12-13: qty 90, 90d supply, fill #0
  Filled 2024-03-14: qty 90, 90d supply, fill #1

## 2023-12-22 ENCOUNTER — Other Ambulatory Visit: Payer: Self-pay

## 2023-12-22 ENCOUNTER — Other Ambulatory Visit (HOSPITAL_COMMUNITY): Payer: Self-pay

## 2023-12-22 MED ORDER — SEMAGLUTIDE-WEIGHT MANAGEMENT 2.4 MG/0.75ML ~~LOC~~ SOAJ
2.4000 mg | SUBCUTANEOUS | 3 refills | Status: AC
  Filled 2023-12-22: qty 3, 28d supply, fill #0

## 2024-01-02 ENCOUNTER — Other Ambulatory Visit (HOSPITAL_COMMUNITY): Payer: Self-pay

## 2024-01-04 ENCOUNTER — Other Ambulatory Visit (HOSPITAL_COMMUNITY): Payer: Self-pay

## 2024-01-04 MED ORDER — PREDNISONE 20 MG PO TABS
40.0000 mg | ORAL_TABLET | Freq: Every day | ORAL | 0 refills | Status: DC
  Filled 2024-01-04: qty 10, 5d supply, fill #0

## 2024-01-05 ENCOUNTER — Other Ambulatory Visit: Payer: Self-pay

## 2024-01-08 ENCOUNTER — Other Ambulatory Visit (HOSPITAL_COMMUNITY): Payer: Self-pay

## 2024-01-09 ENCOUNTER — Other Ambulatory Visit (HOSPITAL_COMMUNITY): Payer: Self-pay

## 2024-01-09 ENCOUNTER — Other Ambulatory Visit: Payer: Self-pay

## 2024-01-09 MED ORDER — ALLOPURINOL 300 MG PO TABS
300.0000 mg | ORAL_TABLET | Freq: Every day | ORAL | 1 refills | Status: DC
  Filled 2024-01-09: qty 90, 90d supply, fill #0
  Filled 2024-04-13: qty 90, 90d supply, fill #1

## 2024-01-09 MED ORDER — POTASSIUM CHLORIDE CRYS ER 20 MEQ PO TBCR
20.0000 meq | EXTENDED_RELEASE_TABLET | Freq: Every day | ORAL | 1 refills | Status: DC
  Filled 2024-01-09: qty 90, 90d supply, fill #0
  Filled 2024-04-13: qty 90, 90d supply, fill #1

## 2024-01-24 ENCOUNTER — Other Ambulatory Visit (HOSPITAL_COMMUNITY): Payer: Self-pay

## 2024-01-25 ENCOUNTER — Other Ambulatory Visit (HOSPITAL_COMMUNITY): Payer: Self-pay

## 2024-01-25 MED ORDER — METOPROLOL SUCCINATE ER 100 MG PO TB24
100.0000 mg | ORAL_TABLET | Freq: Every day | ORAL | 1 refills | Status: DC
  Filled 2024-01-25: qty 90, 90d supply, fill #0
  Filled 2024-04-29: qty 90, 90d supply, fill #1

## 2024-02-14 ENCOUNTER — Other Ambulatory Visit (HOSPITAL_COMMUNITY): Payer: Self-pay

## 2024-02-15 ENCOUNTER — Other Ambulatory Visit (HOSPITAL_COMMUNITY): Payer: Self-pay

## 2024-02-15 MED ORDER — MELOXICAM 15 MG PO TABS
15.0000 mg | ORAL_TABLET | Freq: Every day | ORAL | 1 refills | Status: AC
  Filled 2024-02-15: qty 90, 90d supply, fill #0
  Filled 2024-06-18: qty 90, 90d supply, fill #1

## 2024-02-22 ENCOUNTER — Other Ambulatory Visit (HOSPITAL_COMMUNITY): Payer: Self-pay

## 2024-02-24 ENCOUNTER — Other Ambulatory Visit (HOSPITAL_COMMUNITY): Payer: Self-pay

## 2024-03-13 ENCOUNTER — Other Ambulatory Visit (HOSPITAL_COMMUNITY): Payer: Self-pay

## 2024-03-13 ENCOUNTER — Other Ambulatory Visit: Payer: Self-pay

## 2024-03-13 MED ORDER — WEGOVY 2.4 MG/0.75ML ~~LOC~~ SOAJ
2.4000 mg | SUBCUTANEOUS | 3 refills | Status: AC
  Filled 2024-03-13 – 2024-03-14 (×2): qty 3, 28d supply, fill #0

## 2024-03-14 ENCOUNTER — Other Ambulatory Visit (HOSPITAL_COMMUNITY): Payer: Self-pay

## 2024-03-14 ENCOUNTER — Other Ambulatory Visit: Payer: Self-pay

## 2024-03-15 ENCOUNTER — Other Ambulatory Visit (HOSPITAL_COMMUNITY): Payer: Self-pay

## 2024-03-20 ENCOUNTER — Other Ambulatory Visit (HOSPITAL_COMMUNITY): Payer: Self-pay

## 2024-03-27 ENCOUNTER — Other Ambulatory Visit (HOSPITAL_COMMUNITY): Payer: Self-pay

## 2024-03-27 MED ORDER — TIZANIDINE HCL 4 MG PO TABS
4.0000 mg | ORAL_TABLET | Freq: Every evening | ORAL | 0 refills | Status: AC | PRN
  Filled 2024-03-27: qty 30, 30d supply, fill #0

## 2024-03-27 MED ORDER — PREDNISONE 20 MG PO TABS
40.0000 mg | ORAL_TABLET | Freq: Every day | ORAL | 0 refills | Status: AC
  Filled 2024-03-27: qty 10, 5d supply, fill #0

## 2024-03-28 ENCOUNTER — Other Ambulatory Visit (HOSPITAL_COMMUNITY): Payer: Self-pay

## 2024-03-28 MED ORDER — OMEPRAZOLE 20 MG PO CPDR
20.0000 mg | DELAYED_RELEASE_CAPSULE | Freq: Every day | ORAL | 1 refills | Status: DC
  Filled 2024-03-28: qty 90, 90d supply, fill #0
  Filled 2024-06-18: qty 90, 90d supply, fill #1

## 2024-04-13 ENCOUNTER — Other Ambulatory Visit (HOSPITAL_COMMUNITY): Payer: Self-pay

## 2024-04-16 ENCOUNTER — Other Ambulatory Visit (HOSPITAL_COMMUNITY): Payer: Self-pay

## 2024-04-30 ENCOUNTER — Other Ambulatory Visit (HOSPITAL_COMMUNITY): Payer: Self-pay

## 2024-05-21 ENCOUNTER — Other Ambulatory Visit (HOSPITAL_COMMUNITY): Payer: Self-pay

## 2024-05-22 ENCOUNTER — Other Ambulatory Visit (HOSPITAL_COMMUNITY): Payer: Self-pay

## 2024-05-22 MED ORDER — CHLORTHALIDONE 25 MG PO TABS
25.0000 mg | ORAL_TABLET | Freq: Every day | ORAL | 2 refills | Status: AC
  Filled 2024-05-22: qty 90, 90d supply, fill #0
  Filled 2024-08-17: qty 90, 90d supply, fill #1

## 2024-06-18 ENCOUNTER — Other Ambulatory Visit: Payer: Self-pay

## 2024-06-18 ENCOUNTER — Other Ambulatory Visit (HOSPITAL_COMMUNITY): Payer: Self-pay

## 2024-06-19 ENCOUNTER — Other Ambulatory Visit (HOSPITAL_COMMUNITY): Payer: Self-pay

## 2024-06-19 MED ORDER — AMLODIPINE BESYLATE-VALSARTAN 5-320 MG PO TABS
1.0000 | ORAL_TABLET | Freq: Every day | ORAL | 1 refills | Status: AC
  Filled 2024-06-19: qty 90, 90d supply, fill #0
  Filled 2024-09-20: qty 90, 90d supply, fill #1

## 2024-07-10 ENCOUNTER — Other Ambulatory Visit (HOSPITAL_COMMUNITY): Payer: Self-pay

## 2024-07-10 MED ORDER — POTASSIUM CHLORIDE CRYS ER 20 MEQ PO TBCR
20.0000 meq | EXTENDED_RELEASE_TABLET | Freq: Every day | ORAL | 1 refills | Status: AC
  Filled 2024-07-10: qty 90, 90d supply, fill #0
  Filled 2024-10-17: qty 90, 90d supply, fill #1

## 2024-07-10 MED ORDER — ALLOPURINOL 300 MG PO TABS
300.0000 mg | ORAL_TABLET | Freq: Every day | ORAL | 1 refills | Status: AC
  Filled 2024-07-10: qty 90, 90d supply, fill #0
  Filled 2024-10-17: qty 90, 90d supply, fill #1

## 2024-07-17 ENCOUNTER — Other Ambulatory Visit (HOSPITAL_COMMUNITY): Payer: Self-pay

## 2024-07-28 ENCOUNTER — Other Ambulatory Visit (HOSPITAL_COMMUNITY): Payer: Self-pay

## 2024-07-31 ENCOUNTER — Other Ambulatory Visit: Payer: Self-pay

## 2024-07-31 ENCOUNTER — Other Ambulatory Visit (HOSPITAL_COMMUNITY): Payer: Self-pay

## 2024-07-31 MED ORDER — PREDNISONE 20 MG PO TABS
ORAL_TABLET | ORAL | 0 refills | Status: AC
  Filled 2024-07-31: qty 10, 5d supply, fill #0

## 2024-07-31 MED ORDER — METOPROLOL SUCCINATE ER 100 MG PO TB24
100.0000 mg | ORAL_TABLET | Freq: Every day | ORAL | 1 refills | Status: AC
  Filled 2024-07-31 (×2): qty 90, 90d supply, fill #0

## 2024-08-03 DIAGNOSIS — I10 Essential (primary) hypertension: Secondary | ICD-10-CM | POA: Diagnosis not present

## 2024-08-03 DIAGNOSIS — Z0000 Encounter for general adult medical examination without abnormal findings: Secondary | ICD-10-CM | POA: Diagnosis not present

## 2024-08-03 DIAGNOSIS — G4733 Obstructive sleep apnea (adult) (pediatric): Secondary | ICD-10-CM | POA: Diagnosis not present

## 2024-08-17 ENCOUNTER — Other Ambulatory Visit (HOSPITAL_COMMUNITY): Payer: Self-pay

## 2024-08-17 ENCOUNTER — Other Ambulatory Visit: Payer: Self-pay

## 2024-09-20 ENCOUNTER — Other Ambulatory Visit (HOSPITAL_COMMUNITY): Payer: Self-pay

## 2024-09-21 ENCOUNTER — Other Ambulatory Visit (HOSPITAL_COMMUNITY): Payer: Self-pay

## 2024-09-24 ENCOUNTER — Other Ambulatory Visit (HOSPITAL_COMMUNITY): Payer: Self-pay

## 2024-09-24 MED ORDER — OMEPRAZOLE 20 MG PO CPDR
20.0000 mg | DELAYED_RELEASE_CAPSULE | Freq: Every day | ORAL | 1 refills | Status: AC
  Filled 2024-09-24: qty 90, 90d supply, fill #0

## 2024-09-25 ENCOUNTER — Other Ambulatory Visit: Payer: Self-pay

## 2024-10-17 ENCOUNTER — Other Ambulatory Visit (HOSPITAL_COMMUNITY): Payer: Self-pay

## 2024-10-18 ENCOUNTER — Other Ambulatory Visit (HOSPITAL_COMMUNITY): Payer: Self-pay

## 2024-10-18 MED ORDER — TIZANIDINE HCL 4 MG PO TABS
4.0000 mg | ORAL_TABLET | Freq: Every day | ORAL | 0 refills | Status: AC
  Filled 2024-10-18: qty 30, 30d supply, fill #0
